# Patient Record
Sex: Male | Born: 1960 | Race: White | Hispanic: No | Marital: Married | Smoking: Former smoker
Health system: Southern US, Community
[De-identification: ages and names within clinical notes are randomized; demographics above are authoritative.]

## PROBLEM LIST (undated history)

## (undated) DIAGNOSIS — I10 Essential (primary) hypertension: Secondary | ICD-10-CM

## (undated) DIAGNOSIS — E785 Hyperlipidemia, unspecified: Secondary | ICD-10-CM

## (undated) DIAGNOSIS — K219 Gastro-esophageal reflux disease without esophagitis: Secondary | ICD-10-CM

## (undated) DIAGNOSIS — I2510 Atherosclerotic heart disease of native coronary artery without angina pectoris: Secondary | ICD-10-CM

## (undated) HISTORY — DX: Gastro-esophageal reflux disease without esophagitis: K21.9

## (undated) HISTORY — DX: Essential (primary) hypertension: I10

## (undated) HISTORY — PX: TONSILLECTOMY: SUR1361

---

## 1985-08-05 HISTORY — PX: INGUINAL HERNIA REPAIR: SUR1180

## 2010-08-07 ENCOUNTER — Encounter: Payer: Self-pay | Admitting: Cardiovascular Disease

## 2010-08-21 ENCOUNTER — Encounter: Payer: Self-pay | Admitting: Cardiovascular Disease

## 2010-09-13 ENCOUNTER — Encounter: Payer: Self-pay | Admitting: Cardiovascular Disease

## 2010-09-13 ENCOUNTER — Ambulatory Visit (INDEPENDENT_AMBULATORY_CARE_PROVIDER_SITE_OTHER): Payer: PRIVATE HEALTH INSURANCE | Admitting: Cardiovascular Disease

## 2010-09-13 ENCOUNTER — Encounter (INDEPENDENT_AMBULATORY_CARE_PROVIDER_SITE_OTHER): Payer: PRIVATE HEALTH INSURANCE

## 2010-09-13 DIAGNOSIS — E785 Hyperlipidemia, unspecified: Secondary | ICD-10-CM

## 2010-09-13 DIAGNOSIS — IMO0001 Reserved for inherently not codable concepts without codable children: Secondary | ICD-10-CM | POA: Insufficient documentation

## 2010-09-13 DIAGNOSIS — R079 Chest pain, unspecified: Secondary | ICD-10-CM

## 2010-09-13 DIAGNOSIS — I10 Essential (primary) hypertension: Secondary | ICD-10-CM

## 2010-09-13 DIAGNOSIS — E1169 Type 2 diabetes mellitus with other specified complication: Secondary | ICD-10-CM | POA: Insufficient documentation

## 2010-09-20 NOTE — Assessment & Plan Note (Signed)
Summary: CHEST PAIN/HTN/[facility]   Visit Type:  Initial Consult Primary Provider:  Dr. Stanford Scotland [facility]  CC:  c/o indigestion mostly; but occas. gets anxious and has chest pain.Marland Kitchen  History of Present Illness: Alec Ali is a very pleasant 50 year old gentleman, patient of Dr. Andrey Spearman, who works in [employer], history of hypertension, GERD who presents for evaluation of recent episodes of chest pain.  He reports that at the end of January, it was his birthday and he drank a significant amount of Christiane Ha. 2 days later, he had significant chest pain with radiation into the left chest. He is especially nervous about his chest pain episode given a aunt who had recent heart attack. He reports his parents are in good health with no premature coronary artery disease.  The chest pain episode lasted all night, with stuttering chest pain since then, typically at rest. He does work in [employer] representative and denies any significant chest pain with exertion. He still nervous about his symptoms. He has lost significant weight over the past month by changing his diet. He estimates 15 pounds.  EKG shows normal sinus rhythm with rate 91 beats per minute, Nonspecific ST changes in the anterolateral leads concerning for repolarization abnormality/LVH  Current Medications (verified): 1)  Lisinopril 30 Mg Tabs (Lisinopril) .Marland Kitchen.. 1 Tablet Once Daily 2)  Zantac 150 Maximum Strength 150 Mg Tabs (Ranitidine Hcl) .Marland Kitchen.. 1 Tablet Once Daily 3)  Prilosec Otc 20 Mg Tbec (Omeprazole Magnesium) .... One Tablet Once Daily  Allergies (verified): No Known Drug Allergies  Past History:  Past Medical History: Last updated: 09/13/2010 Hypertension G E R D  Past Surgical History: Last updated: 09/13/2010 Tonsillectomy Inguinal Hernia Repair-1987-bilateral  Family History: Last updated: 09/13/2010 Father: Hypertension,COPD Mother: Breast cancer,Diabetes,thyroid problems Maternal grandmother-colon  cancer  Social History: Last updated: 09/13/2010 Full Time Married  Tobacco Use - Former snuff user Alcohol Use - yes-occasional Regular Exercise - no Drug Use - no  Risk Factors: Exercise: no (09/13/2010)  Risk Factors: Smoking Status: quit (09/13/2010)  Review of Systems       The patient complains of chest pain.  The patient denies fever, weight loss, weight gain, vision loss, decreased hearing, hoarseness, syncope, dyspnea on exertion, peripheral edema, prolonged cough, abdominal pain, incontinence, muscle weakness, depression, and enlarged lymph nodes.         GERD sx  Vital Signs:  Patient profile:   50 year old male Height:      71 inches Weight:      246 pounds BMI:     34.43 Pulse rate:   91 / minute BP sitting:   160 / 82  (left arm) Cuff size:   large  Vitals Entered By: Bishop Dublin, CMA (September 13, 2010 3:34 PM)  Physical Exam  General:  Well developed, well nourished, in no acute distress. Head:  normocephalic and atraumatic Neck:  Neck supple, no JVD. No masses, thyromegaly or abnormal cervical nodes. Lungs:  Clear bilaterally to auscultation and percussion. Heart:  Non-displaced PMI, chest non-tender; regular rate and rhythm, S1, S2 without murmurs, rubs or gallops. Carotid upstroke normal, no bruit.  Pedals normal pulses. No edema, no varicosities. Abdomen:  Bowel sounds positive; abdomen soft and non-tender without masses Msk:  Back normal, normal gait. Muscle strength and tone normal. Pulses:  pulses normal in all 4 extremities Extremities:  No clubbing or cyanosis. Neurologic:  Alert and oriented x 3. Skin:  Intact without lesions or rashes. Psych:  Normal affect.   Impression &  Recommendations:  Problem # 1:  CHEST PAIN UNSPECIFIED (ICD-786.50) I suspect his chest pain is atypical in nature, likely secondary to musculoskeletal or GERD. He is still very nervous about his symptoms..  We have scheduled a treadmill studyto be done  today.  The following medications were removed from the medication list:    Lisinopril-hydrochlorothiazide 20-12.5 Mg Tabs (Lisinopril-hydrochlorothiazide) .Marland Kitchen... 1 tablet once daily    Bayer Low Strength 81 Mg Tbec (Aspirin) .Marland Kitchen... 1 tablet once daily His updated medication list for this problem includes:    Lisinopril 30 Mg Tabs (Lisinopril) .Marland Kitchen... 1 tablet once daily  Orders: EKG w/ Interpretation (93000)  Problem # 2:  HYPERLIPIDEMIA-MIXED (ICD-272.4) we are very encouraged by his recent weight loss and diet changes. This should significantly improve his lipid profile  Problem # 3:  HYPERTENSION, BENIGN (ICD-401.1) have asked him to closely track his blood pressure at home. Borderline elevated today.  The following medications were removed from the medication list:    Lisinopril-hydrochlorothiazide 20-12.5 Mg Tabs (Lisinopril-hydrochlorothiazide) .Marland Kitchen... 1 tablet once daily    Bayer Low Strength 81 Mg Tbec (Aspirin) .Marland Kitchen... 1 tablet once daily His updated medication list for this problem includes:    Lisinopril 30 Mg Tabs (Lisinopril) .Marland Kitchen... 1 tablet once daily  Appended Document: CHEST PAIN/HTN/[facility] exercise treadmill report: Performed September 13, 2010 Reason for study; chest pain  Resting EKG showed normal sinus rhythm with rate of 86 beats per minute, nonspecific ST changes in the anterolateral leads, borderline voltage criteria for LVH in the limb leads. resting blood pressure 160/80  Alec Ali exercised for 10 minutes, achieving 12.9 mets, peak heart rate 184 beats per minute with target heart rate of 147 beats minute, peak blood pressure 196/92. in recovery, heart rate improved to 109 beats per minute after 3 minutes, blood pressure still 188/60  He had 3 mm downsloping ST changes at peak exercise, consistent with repolarization abnormality. He had no symptoms of shortness of breath or chest pain with activity.  Summary: EKG findings consistent with LVH and  repolarization abnormality, ST abnormality is downsloping with 3 mm depression consistent with repolarization changes. Findings are equivocal. Given no clinical signs of  angina with heavy exertion up to peak heart rate greater than 180 beats per minute, no further testing is needed. We have discussed with him risk management including further weight loss, blood pressure control.

## 2010-10-16 NOTE — Letter (Signed)
Summary: [facility] Office Visit Note   [facility] Office Visit Note   Imported By: Roderic Ovens 10/08/2010 15:22:00  _____________________________________________________________________  External Attachment:    Type:   Image     Comment:   External Document

## 2010-10-16 NOTE — Letter (Signed)
Summary: [facility] Office Visit Note   [facility] Office Visit Note   Imported By: Roderic Ovens 10/08/2010 15:22:24  _____________________________________________________________________  External Attachment:    Type:   Image     Comment:   External Document

## 2011-02-11 ENCOUNTER — Encounter: Payer: Self-pay | Admitting: Cardiovascular Disease

## 2011-08-08 ENCOUNTER — Ambulatory Visit: Payer: Self-pay | Admitting: Gastroenterology

## 2012-09-16 ENCOUNTER — Ambulatory Visit: Payer: PRIVATE HEALTH INSURANCE | Admitting: Internal Medicine

## 2012-09-17 ENCOUNTER — Ambulatory Visit: Payer: PRIVATE HEALTH INSURANCE | Admitting: Internal Medicine

## 2012-10-02 ENCOUNTER — Ambulatory Visit (INDEPENDENT_AMBULATORY_CARE_PROVIDER_SITE_OTHER): Payer: 59 | Admitting: Internal Medicine

## 2012-10-02 ENCOUNTER — Encounter: Payer: Self-pay | Admitting: Internal Medicine

## 2012-10-02 VITALS — BP 162/78 | HR 97 | Temp 97.9°F | Resp 16 | Ht 71.0 in | Wt 264.5 lb

## 2012-10-02 MED ORDER — METOPROLOL SUCCINATE ER 50 MG PO TB24: 50.0000 mg | ORAL_TABLET | Freq: Every day | ORAL | Status: DC

## 2012-10-02 MED ORDER — LISINOPRIL 30 MG PO TABS: 30.0000 mg | ORAL_TABLET | Freq: Every day | ORAL | Status: DC

## 2012-10-02 NOTE — Progress Notes (Signed)
Patient ID: Alec Ali., male   DOB: Oct 08, 1960, 52 y.o.   MRN: [id number]      Patient Active Problem List  Diagnosis  . HYPERLIPIDEMIA-MIXED  . HYPERTENSION, BENIGN  . CHEST PAIN UNSPECIFIED  . Obesity, unspecified    Subjective:  CC:   Chief Complaint  Patient presents with  . Establish Care    HPI:   Alec Ray Antoneo Ghrist. is a 52 y.o. male who presents as a new patient to establish primary care with the chief complaint of wt gain of 15 lbs over the holidays.  Reports a big appetite.  Bfast : special K and banana   Lunch: salad with a protein . Dinner: eats half a pizza once a week,  Home cooked meals during the week. Not much red meat. Grilled chicken,  Chicken alfredo,  Starts hiking every Spring through Fall during hiking season every weekend.  Hikes 5-10 miles per day at 10K feet with no symptoms of chest pain claudication or pulmonary edema  HTN:  Needs lisinopril refilled on Sunday .  Needs all meds refilled   History of chest pain,  Occurred after a night of heavy drinking.  Negative stress test at [facility]  In 2012,went on an anti reflux diet  followed by an endoscopy Jan 2013.  Treated with organic vinegar for immediate relief , never took the prilosec prescribed by [facility] because one week trial made it worse.  Ended up losing  20 lbs due to persistent symptom aggravated by eating. Has heartburn only  occasionally now when really hungry ,  Not treated.    Past Medical History  Diagnosis Date  . Hypertension   . GERD (gastroesophageal reflux disease)     Past Surgical History  Procedure Laterality Date  . Tonsillectomy    . Inguinal hernia repair  1987    bilateral    Family History  Problem Relation Age of Onset  . Breast cancer Mother   . Diabetes Mother   . Thyroid disease Mother   . Hypertension Father   . COPD Father   . Colon cancer Maternal Grandmother     History   Social History  . Marital Status: Married    Spouse Name: N/A   Number of Children: N/A  . Years of Education: N/A   Occupational History  . CONSTRUCTION     Full time   Social History Main Topics  . Smoking status: Former Games developer  . Smokeless tobacco: Former Neurosurgeon    Types: Snuff  . Alcohol Use: Yes     Comment: Occasional  . Drug Use: No  . Sexually Active: Not on file   Other Topics Concern  . Not on file   Social History Narrative   Married   Does not get regular exercise       @ALLHX @    Review of Systems:   The remainder of the review of systems was negative except those addressed in the HPI.       Objective:  BP 162/78  Pulse 97  Temp(Src) 97.9 F (36.6 C) (Oral)  Resp 16  Ht 5\' 11"  (1.803 m)  Wt 264 lb 8 oz (119.976 kg)  BMI 36.91 kg/m2  SpO2 99%  General appearance: alert, cooperative and appears stated age Ears: normal TM's and external ear canals both ears Throat: lips, mucosa, and tongue normal; teeth and gums normal Neck: no adenopathy, no carotid bruit, supple, symmetrical, trachea midline and thyroid not enlarged, symmetric, no tenderness/mass/nodules Back:  symmetric, no curvature. ROM normal. No CVA tenderness. Lungs: clear to auscultation bilaterally Heart: regular rate and rhythm, S1, S2 normal, no murmur, click, rub or gallop Abdomen: soft, non-tender; bowel sounds normal; no masses,  no organomegaly Pulses: 2+ and symmetric Skin: Skin color, texture, turgor normal. No rashes or lesions Lymph nodes: Cervical, supraclavicular, and axillary nodes normal.  Assessment and Plan:  HYPERTENSION, BENIGN Not well controlled on current regimen. Renal function due, no changes today unitl renal function can be assessed and home mesaurements can be reviewed.   Obesity, unspecified BMI is 36.  I have addressed  BMI and recommended a low glycemic index diet utilizing smaller more frequent meals to increase metabolism.  I have also recommended that patient start exercising with a goal of 30 minutes of aerobic  exercise a minimum of 5 days per week. Screening for lipid disorders, thyroid and diabetes to be done next week    HYPERLIPIDEMIA-MIXED Managed in the past with diet and fish oil. Repeat fasting lipids to be done next week.  CHEST PAIN UNSPECIFIED Resolved and attributed to esophagitis after cardiac evaluation 2012 was normal.  GERD (gastroesophageal reflux disease) With a history of severe esophagitis apparently undertreated by patient per his report of avoiding use of PPIs.His symptoms are currently infrequent occurring less than 2 or 3 times per week. He had an endoscopy in January 2013 it was reportedly normal. Recommended to use over-the-counter H2 blockers as needed for symptom control and to change to daily use of PPI if his symptoms become more frequent.the risks of untreated GERD were discussed with patient specifically her esophagus and esophageal cancer.   A total of 45 minutes was spent with patient more than half of which was spent in counseling, reviewing records from other providers and coordination of care.  Updated Medication List Outpatient Encounter Prescriptions as of 10/02/2012  Medication Sig Dispense Refill  . Cholecalciferol (VITAMIN D) 2000 UNITS tablet Take 2,000 Units by mouth daily.      . fish oil-omega-3 fatty acids 1000 MG capsule Take 2 g by mouth daily.      Marland Kitchen lisinopril (PRINIVIL,ZESTRIL) 30 MG tablet Take 1 tablet (30 mg total) by mouth daily.  30 tablet  5  . metoprolol succinate (TOPROL-XL) 50 MG 24 hr tablet Take 1 tablet (50 mg total) by mouth daily. Take with or immediately following a meal.  30 tablet  5  . ranitidine (ZANTAC) 150 MG tablet Take 150 mg by mouth daily.        . [DISCONTINUED] lisinopril (PRINIVIL,ZESTRIL) 30 MG tablet Take 30 mg by mouth daily.        . [DISCONTINUED] metoprolol succinate (TOPROL-XL) 50 MG 24 hr tablet Take 50 mg by mouth daily. Take with or immediately following a meal.      . [DISCONTINUED] omeprazole (PRILOSEC OTC)  20 MG tablet Take 20 mg by mouth daily.         No facility-administered encounter medications on file as of 10/02/2012.

## 2012-10-02 NOTE — Patient Instructions (Addendum)
You can lose up to 10%  Of your current body weight over the next 3 months with proper diet and exercise (30 minutes 5 days per week)   This is  my version of a  "Low GI"  Diet:  It is not ultra low carb, but will still lower your blood sugars and allow you to lose 5 to 10 lbs per month if you follow it carefully. All of the foods can be found at grocery stores and in bulk at [employer].  The Atkins protein bars and shakes are available in more varieties at [employer], [employer] and [employer].     7 AM Breakfast:  Low carbohydrate Protein  Shakes (I recommend the EAS AdvantEdge "Carb Control" shakes  Or the low carb shakes by Atkins.   Both are available everywhere:  In  cases at [employer]Js  Or in 4 packs at grocery stores and pharmacies  2.5 carbs  (Alternative is  a toasted Arnold's Sandwhich Thin w/ peanut butter, a "Bagel Thin" with cream cheese and salmon) or  a scrambled egg burrito made with a low carb tortilla .  Avoid cereal and bananas, oatmeal too unless you are cooking the old fashioned kind that takes 30-40 minutes to prepare.  the rest is overly processed, has minimal fiber, and is loaded with carbohydrates!   10 AM: Protein bar by Atkins (the snack size, under 200 cal).  There are many varieties , available widely again or in bulk in limited varieties at BJs)  Other so called "protein bars" tend to be loaded with carbohydrates.  Remember, in food advertising, the word "energy" is synonymous for " carbohydrate."  Lunch: sandwich of [place], (or any lunchmeat, grilled meat or canned tuna), fresh avocado, mayonnaise  and cheese on a lower carbohydrate pita bread, flatbread, or tortilla . Ok to use regular mayonnaise. The bread is the only source or carbohydrate that can be decreased (Joseph's makes a pita bread and a flat bread that are 50 cal and 4 net carbs ; Toufayan makes a low carb flatbread that's 100 cal and 9 net carbs  and  Mission makes a low carb whole wheat tortilla  That is 210 cal and 6 net  carbs)  3 PM:  Mid day :  Another protein bar,  Or a  cheese stick (100 cal, 0 carbs),  Or 1 ounce of  almonds, walnuts, pistachios, pecans, peanuts,  Macadamia nuts. Or a Dannon light n Fit greek yogurt, 80 cal 8 net carbs . Avoid "granola"; the dried cranberries and raisins are loaded with carbohydrates. Mixed nuts ok if no raisins or cranberries or dried fruit.      6 PM  Dinner:  "mean and green:"  Meat/chicken/fish or a high protein legume; , with a green salad, and a low GI  Veggie (broccoli, cauliflower, green beans, spinach, brussel sprouts. Lima beans) : Avoid "Low fat dressings, as well as Reyne Dumas and [address]! They are loaded with sugar! Instead use ranch, vinagrette,  Blue cheese, etc.  There is a low carb pasta by Dreamfield's available at [facility] that is acceptable and tastes great. Try Michel Angel's chicken piccata over low carb pasta. The chicken dish is 0 carbs, and can be found in frozen section at BJs and [employer]. Also try [employer] "Carnitas" (pulled pork, no sauce,  0 carbs) and his pot roast.   both are in the refrigerated section at BJs   Dreamfield's makes a low carb pasta only 5  g/serving.  Available at all grocery stores,  And tastes like normal pasta  9 PM snack : Breyer's "low carb" fudgsicle or  ice cream bar (Carb Smart line), or  Weight Watcher's ice cream bar , or another "no sugar added" ice cream;a serving of fresh berries/cherries with whipped cream (Avoid bananas, pineapple, grapes  and watermelon on a regular basis because they are high in sugar)   Remember that snack Substitutions should be less than 10 carbs per serving and meals < 20 carbs. Remember to subtract fiber grams and sugar alcohols to get the "net carbs."

## 2012-10-04 ENCOUNTER — Encounter: Payer: Self-pay | Admitting: Internal Medicine

## 2012-10-04 DIAGNOSIS — K219 Gastro-esophageal reflux disease without esophagitis: Secondary | ICD-10-CM | POA: Insufficient documentation

## 2012-10-04 NOTE — Assessment & Plan Note (Addendum)
Not well controlled on current regimen. Renal function due, no changes today unitl renal function can be assessed and home mesaurements can be reviewed.

## 2012-10-04 NOTE — Assessment & Plan Note (Signed)
Managed in the past with diet and fish oil. Repeat fasting lipids to be done next week.

## 2012-10-04 NOTE — Assessment & Plan Note (Addendum)
Resolved and attributed to esophagitis after cardiac evaluation 2012 was normal.

## 2012-10-04 NOTE — Assessment & Plan Note (Signed)
BMI is 36.  I have addressed  BMI and recommended a low glycemic index diet utilizing smaller more frequent meals to increase metabolism.  I have also recommended that patient start exercising with a goal of 30 minutes of aerobic exercise a minimum of 5 days per week. Screening for lipid disorders, thyroid and diabetes to be done next week

## 2012-10-04 NOTE — Assessment & Plan Note (Addendum)
With a history of severe esophagitis apparently undertreated by patient per his report of avoiding use of PPIs.His symptoms are currently infrequent occurring less than 2 or 3 times per week. He had an endoscopy in January 2013 it was reportedly normal. Recommended to use over-the-counter H2 blockers as needed for symptom control and to change to daily use of PPI if his symptoms become more frequent.the risks of untreated GERD were discussed with patient specifically her esophagus and esophageal cancer.

## 2012-10-05 ENCOUNTER — Encounter: Payer: Self-pay | Admitting: Internal Medicine

## 2012-10-05 ENCOUNTER — Telehealth: Payer: Self-pay | Admitting: Internal Medicine

## 2012-10-05 DIAGNOSIS — E559 Vitamin D deficiency, unspecified: Secondary | ICD-10-CM | POA: Insufficient documentation

## 2012-10-05 MED ORDER — LISINOPRIL 10 MG PO TABS: 10.0000 mg | ORAL_TABLET | Freq: Every day | ORAL | Status: DC

## 2012-10-05 NOTE — Telephone Encounter (Signed)
Pt went and picked up Lisinopril medication but it was written for 30 mg and it was already picked up. He is needing the 40 mg's and the pt is not sure what to do ???

## 2012-10-05 NOTE — Telephone Encounter (Signed)
He can pick up the 10 mg lisinopril rx and take a 30 and a 10.  Please correct chart going forward so this doesn't keep happening.

## 2012-10-06 ENCOUNTER — Other Ambulatory Visit: Payer: Self-pay | Admitting: General Practice

## 2012-10-06 ENCOUNTER — Telehealth: Payer: Self-pay | Admitting: Internal Medicine

## 2012-10-06 MED ORDER — LISINOPRIL 40 MG PO TABS: 40.0000 mg | ORAL_TABLET | Freq: Every day | ORAL | Status: DC

## 2012-10-06 NOTE — Telephone Encounter (Signed)
Confirmed with pharmacy that they have the script for Lisinopril 40mg . Ins only pay for #30. Patients wife is aware.

## 2012-10-06 NOTE — Telephone Encounter (Signed)
This was changed to Lisinopril 40mg . And reordered.

## 2012-10-06 NOTE — Telephone Encounter (Signed)
Spoke with patients wife  and they did not accept the 30mg  or 10mg  prescription for the Lisinopril. The patients needs the 40mg  dose called in and would like a 90 day supply. Pt last seen 10-02-12.

## 2012-10-06 NOTE — Telephone Encounter (Signed)
The pharmacy took back the prescription for the lisinopril 30mg  and the lisinopril 10mg  . The patient's wife is wanting the patient to have lisinopril 40 mg in one dose not two pills for #90.

## 2012-10-22 ENCOUNTER — Encounter: Payer: Self-pay | Admitting: Internal Medicine

## 2012-12-31 ENCOUNTER — Ambulatory Visit (INDEPENDENT_AMBULATORY_CARE_PROVIDER_SITE_OTHER): Payer: BC Managed Care – PPO | Admitting: Internal Medicine

## 2012-12-31 ENCOUNTER — Encounter: Payer: Self-pay | Admitting: Internal Medicine

## 2012-12-31 VITALS — BP 142/82 | HR 79 | Temp 97.8°F | Resp 16 | Wt 256.8 lb

## 2012-12-31 DIAGNOSIS — I10 Essential (primary) hypertension: Secondary | ICD-10-CM

## 2012-12-31 DIAGNOSIS — E119 Type 2 diabetes mellitus without complications: Secondary | ICD-10-CM

## 2012-12-31 DIAGNOSIS — E669 Obesity, unspecified: Secondary | ICD-10-CM

## 2012-12-31 DIAGNOSIS — E785 Hyperlipidemia, unspecified: Secondary | ICD-10-CM

## 2012-12-31 LAB — LIPID PANEL
Cholesterol: 181 mg/dL (ref 0–200)
HDL: 34.5 mg/dL — ABNORMAL LOW (ref >39.00)
Total CHOL/HDL Ratio: 5
Triglycerides: 269 mg/dL — ABNORMAL HIGH (ref 0.0–149.0)

## 2012-12-31 LAB — LDL CHOLESTEROL, DIRECT: Direct LDL: 96.2 mg/dL

## 2012-12-31 NOTE — Patient Instructions (Signed)
Your blood pressure has improved and is very close to goal .  Continue to take the lisinopril and metoprolol  Your first goal is 10% , or 26 lbs total .  This is the degree of  weight loss significant enough to improve blood pressure  Return in 6 months (3 monhts if your labs indicate diabetes)

## 2012-12-31 NOTE — Assessment & Plan Note (Signed)
Well controlled on current regimen. Renal function stable, no changes today. 

## 2012-12-31 NOTE — Progress Notes (Signed)
Patient ID: Alec Ali., male   DOB: 1961-06-18, 52 y.o.   MRN: [id number]    Patient Active Problem List   Diagnosis Date Noted  . Unspecified vitamin D deficiency 10/05/2012  . Obesity, unspecified 10/04/2012  . GERD (gastroesophageal reflux disease) 10/04/2012  . HYPERLIPIDEMIA-MIXED 09/13/2010  . HYPERTENSION, BENIGN 09/13/2010  . CHEST PAIN UNSPECIFIED 09/13/2010    Subjective:  CC:   Chief Complaint  Patient presents with  . Follow-up    HPI:   Alec Aliis a 52 y.o. male who presents 3 month follow up on hypertension, hyperlipidemia and obesity.  Has lost 8 lbs,  And his blood pressure has  improved.  However he has been unable to sustain exercise regimen due to persistent knee pain .     Past Medical History  Diagnosis Date  . Hypertension   . GERD (gastroesophageal reflux disease)     EGD normal 08/08/2011 [place]     Past Surgical History  Procedure Laterality Date  . Tonsillectomy    . Inguinal hernia repair  1987    bilateral       The following portions of the patient's history were reviewed and updated as appropriate: Allergies, current medications, and problem list.    Review of Systems:   12 Pt  review of systems was negative except those addressed in the HPI,     History   Social History  . Marital Status: Married    Spouse Name: N/A    Number of Children: N/A  . Years of Education: N/A   Occupational History  . CONSTRUCTION     Full time   Social History Main Topics  . Smoking status: Former Games developer  . Smokeless tobacco: Former Neurosurgeon    Types: Snuff  . Alcohol Use: Yes     Comment: Occasional  . Drug Use: No  . Sexually Active: Not on file   Other Topics Concern  . Not on file   Social History Narrative   Married   Does not get regular exercise    Objective:  BP 142/82  Pulse 79  Temp(Src) 97.8 F (36.6 C) (Oral)  Resp 16  Wt 256 lb 12 oz (116.461 kg)  BMI 35.83 kg/m2  SpO2 99%  General  appearance: alert, cooperative and appears stated age Ears: normal TM's and external ear canals both ears Throat: lips, mucosa, and tongue normal; teeth and gums normal Neck: no adenopathy, no carotid bruit, supple, symmetrical, trachea midline and thyroid not enlarged, symmetric, no tenderness/mass/nodules Back: symmetric, no curvature. ROM normal. No CVA tenderness. Lungs: clear to auscultation bilaterally Heart: regular rate and rhythm, S1, S2 normal, no murmur, click, rub or gallop Abdomen: soft, non-tender; bowel sounds normal; no masses,  no organomegaly Pulses: 2+ and symmetric Skin: Skin color, texture, turgor normal. No rashes or lesions Lymph nodes: Cervical, supraclavicular, and axillary nodes normal.  Assessment and Plan:  HYPERTENSION, BENIGN Well controlled on current regimen. Renal function stable, no changes today.  HYPERLIPIDEMIA-MIXED Trigs are now 296 despite a wt loss of 6 lbs since last visit .  Will discuss fenofibrate trial    Updated Medication List Outpatient Encounter Prescriptions as of 12/31/2012  Medication Sig Dispense Refill  . Cholecalciferol (VITAMIN D) 2000 UNITS tablet Take 2,000 Units by mouth daily.      . fish oil-omega-3 fatty acids 1000 MG capsule Take 2 g by mouth daily.      Marland Kitchen lisinopril (PRINIVIL,ZESTRIL) 40 MG tablet Take 1 tablet (40  mg total) by mouth daily.  90 tablet  3  . metoprolol succinate (TOPROL-XL) 50 MG 24 hr tablet Take 1 tablet (50 mg total) by mouth daily. Take with or immediately following a meal.  30 tablet  5  . ranitidine (ZANTAC) 150 MG tablet Take 150 mg by mouth daily.         No facility-administered encounter medications on file as of 12/31/2012.     No orders of the defined types were placed in this encounter.    Return in about 6 months (around 07/03/2013).

## 2012-12-31 NOTE — Assessment & Plan Note (Signed)
Trigs are now 296 despite a wt loss of 6 lbs since last visit .  Will discuss fenofibrate trial

## 2013-01-03 ENCOUNTER — Encounter: Payer: Self-pay | Admitting: Internal Medicine

## 2013-01-03 DIAGNOSIS — E119 Type 2 diabetes mellitus without complications: Secondary | ICD-10-CM | POA: Insufficient documentation

## 2013-04-05 ENCOUNTER — Other Ambulatory Visit: Payer: Self-pay | Admitting: Internal Medicine

## 2013-04-06 NOTE — Telephone Encounter (Signed)
Eprescribed.

## 2013-06-10 ENCOUNTER — Other Ambulatory Visit: Payer: Self-pay

## 2013-07-06 ENCOUNTER — Encounter: Payer: Self-pay | Admitting: Internal Medicine

## 2013-07-06 ENCOUNTER — Ambulatory Visit (INDEPENDENT_AMBULATORY_CARE_PROVIDER_SITE_OTHER): Payer: BC Managed Care – PPO | Admitting: Internal Medicine

## 2013-07-06 VITALS — BP 146/80 | HR 63 | Temp 98.0°F | Resp 12 | Ht 71.0 in | Wt 248.5 lb

## 2013-07-06 DIAGNOSIS — Z23 Encounter for immunization: Secondary | ICD-10-CM

## 2013-07-06 DIAGNOSIS — I10 Essential (primary) hypertension: Secondary | ICD-10-CM

## 2013-07-06 DIAGNOSIS — E785 Hyperlipidemia, unspecified: Secondary | ICD-10-CM

## 2013-07-06 DIAGNOSIS — E669 Obesity, unspecified: Secondary | ICD-10-CM

## 2013-07-06 DIAGNOSIS — E119 Type 2 diabetes mellitus without complications: Secondary | ICD-10-CM

## 2013-07-06 LAB — MICROALBUMIN / CREATININE URINE RATIO
Creatinine,U: 37.7 mg/dL
Microalb Creat Ratio: 2.1 mg/g (ref 0.0–30.0)
Microalb, Ur: 0.8 mg/dL (ref 0.0–1.9)

## 2013-07-06 LAB — LIPID PANEL
Cholesterol: 179 mg/dL (ref 0–200)
Total CHOL/HDL Ratio: 5
VLDL: 46.6 mg/dL — ABNORMAL HIGH (ref 0.0–40.0)

## 2013-07-06 MED ORDER — LOSARTAN POTASSIUM 50 MG PO TABS: 50.0000 mg | ORAL_TABLET | Freq: Every day | ORAL | Status: DC

## 2013-07-06 NOTE — Assessment & Plan Note (Signed)
Not well controlled on current regimen. checking urine for protein and Renal function.  changing lisinopril to losartan

## 2013-07-06 NOTE — Assessment & Plan Note (Signed)
Repeat A1c and fasting glucose due.  On losartan,  Needs Statin

## 2013-07-06 NOTE — Assessment & Plan Note (Signed)
HIs 10 yr risk od CAD based on May's lipids is 14% without Dx od DM and 23% with the diagnosis.  If fasting glucose is diagnostic today will discuss trial of therapy.

## 2013-07-06 NOTE — Progress Notes (Signed)
Patient ID: Alec Ali., male   DOB: 17-Nov-1960, 52 y.o.   MRN: [id number]   Patient Active Problem List   Diagnosis Date Noted  . Type II or unspecified type diabetes mellitus without mention of complication, not stated as uncontrolled 01/03/2013  . Unspecified vitamin D deficiency 10/05/2012  . Obesity, unspecified 10/04/2012  . GERD (gastroesophageal reflux disease) 10/04/2012  . HYPERLIPIDEMIA-MIXED 09/13/2010  . HYPERTENSION, BENIGN 09/13/2010  . CHEST PAIN UNSPECIFIED 09/13/2010    Subjective:  CC:   Chief Complaint  Patient presents with  . Follow-up    6 months    HPI:    Alec Aliis a 52 y.o. male who presents for 6 month follow up on abnormal A1c suggesting diabetes or prediabetes (a1c was 6.3). Hypertension, hyperlipidemia and obesity  .  Has been eliminating refined sugar from diet but still eating "sugar free" cookes and starches from time to time.  Substituting sweet potatoes but putting cinnamon sugar on them!  Lost 10 lbs on diet and with long hikes as exercised but has gained 5 back due to lack of current exercise and willlpower. Still having some sleep issues .  Taking lisinopril once daily .  Not using cigarettes.  Exercisign occasionally but not regularly since he returned from [place]    Past Medical History  Diagnosis Date  . Hypertension   . GERD (gastroesophageal reflux disease)     EGD normal 08/08/2011 [facility]     Past Surgical History  Procedure Laterality Date  . Tonsillectomy    . Inguinal hernia repair  1987    bilateral       The following portions of the patient's history were reviewed and updated as appropriate: Allergies, current medications, and problem list.    Review of Systems:   12 Pt  review of systems was negative except those addressed in the HPI,     History   Social History  . Marital Status: Married    Spouse Name: N/A    Number of Children: N/A  . Years of Education: N/A   Occupational History  .  CONSTRUCTION     Full time   Social History Main Topics  . Smoking status: Former Games developer  . Smokeless tobacco: Former Neurosurgeon    Types: Snuff  . Alcohol Use: Yes     Comment: Occasional  . Drug Use: No  . Sexual Activity: Yes   Other Topics Concern  . Not on file   Social History Narrative   Married   Does not get regular exercise    Objective:  Filed Vitals:   07/06/13 0819  BP: 146/80  Pulse: 63  Temp: 98 F (36.7 C)  Resp: 12     General appearance: alert, cooperative and appears stated age Ears: normal TM's and external ear canals both ears Throat: lips, mucosa, and tongue normal; teeth and gums normal Neck: no adenopathy, no carotid bruit, supple, symmetrical, trachea midline and thyroid not enlarged, symmetric, no tenderness/mass/nodules Back: symmetric, no curvature. ROM normal. No CVA tenderness. Lungs: clear to auscultation bilaterally Heart: regular rate and rhythm, S1, S2 normal, no murmur, click, rub or gallop Abdomen: soft, non-tender; bowel sounds normal; no masses,  no organomegaly Pulses: 2+ and symmetric Skin: Skin color, texture, turgor normal. No rashes or lesions Lymph nodes: Cervical, supraclavicular, and axillary nodes normal.  Assessment and Plan:  HYPERTENSION, BENIGN Not well controlled on current regimen. checking urine for protein and Renal function.  changing lisinopril to losartan  HYPERLIPIDEMIA-MIXED HIs 10 yr risk od CAD based on May's lipids is 14% without Dx od DM and 23% with the diagnosis.  If fasting glucose is diagnostic today will discuss trial of therapy.   Obesity, unspecified I have acommended him on his wt loss, addressed  BMI and encouraged him to lose 10% of body weigh over the next 6 months using a low glycemic index diet and regular exercise a minimum of 5 days per week.   Type II or unspecified type diabetes mellitus without mention of complication, not stated as uncontrolled Repeat A1c and fasting glucose due.  On  losartan,  Needs Statin    Updated Medication List Outpatient Encounter Prescriptions as of 07/06/2013  Medication Sig  . Cholecalciferol (VITAMIN D) 2000 UNITS tablet Take 2,000 Units by mouth daily.  . fish oil-omega-3 fatty acids 1000 MG capsule Take 2 g by mouth daily.  . metoprolol succinate (TOPROL-XL) 50 MG 24 hr tablet TAKE ONE TABLET BY MOUTH ONCE DAILY. TAKE WITH OR IMMEDIATELY FOLLOWING A MEAL  . ranitidine (ZANTAC) 150 MG tablet Take 150 mg by mouth daily.    . [DISCONTINUED] lisinopril (PRINIVIL,ZESTRIL) 40 MG tablet Take 1 tablet (40 mg total) by mouth daily.  Marland Kitchen losartan (COZAAR) 50 MG tablet Take 1 tablet (50 mg total) by mouth daily.     Orders Placed This Encounter  Procedures  . Lipid panel  . Hemoglobin A1c  . Microalbumin / creatinine urine ratio  . Microalbumin / creatinine urine ratio    No Follow-up on file.

## 2013-07-06 NOTE — Patient Instructions (Signed)
We are changing your lisinopril to losartan for better control.  If your blood glucose today is < 125,  And your A1c is < 6.4,  You are a Prediabetic  .Hypertension As your heart beats, it forces blood through your arteries. This force is your blood pressure. If the pressure is too high, it is called hypertension (HTN) or high blood pressure. HTN is dangerous because you may have it and not know it. High blood pressure may mean that your heart has to work harder to pump blood. Your arteries may be narrow or stiff. The extra work puts you at risk for heart disease, stroke, and other problems.  Blood pressure consists of two numbers, a higher number over a lower, 110/72, for example. It is stated as "110 over 72." The ideal is below 120 for the top number (systolic) and under 80 for the bottom (diastolic). Write down your blood pressure today. You should pay close attention to your blood pressure if you have certain conditions such as:  Heart failure.  Prior heart attack.  Diabetes  Chronic kidney disease.  Prior stroke.  Multiple risk factors for heart disease. To see if you have HTN, your blood pressure should be measured while you are seated with your arm held at the level of the heart. It should be measured at least twice. A one-time elevated blood pressure reading (especially in the Emergency Department) does not mean that you need treatment. There may be conditions in which the blood pressure is different between your right and left arms. It is important to see your caregiver soon for a recheck. Most people have essential hypertension which means that there is not a specific cause. This type of high blood pressure may be lowered by changing lifestyle factors such as:  Stress.  Smoking.  Lack of exercise.  Excessive weight.  Drug/tobacco/alcohol use.  Eating less salt. Most people do not have symptoms from high blood pressure until it has caused damage to the body. Effective  treatment can often prevent, delay or reduce that damage. TREATMENT  When a cause has been identified, treatment for high blood pressure is directed at the cause. There are a large number of medications to treat HTN. These fall into several categories, and your caregiver will help you select the medicines that are best for you. Medications may have side effects. You should review side effects with your caregiver. If your blood pressure stays high after you have made lifestyle changes or started on medicines,   Your medication(s) may need to be changed.  Other problems may need to be addressed.  Be certain you understand your prescriptions, and know how and when to take your medicine.  Be sure to follow up with your caregiver within the time frame advised (usually within two weeks) to have your blood pressure rechecked and to review your medications.  If you are taking more than one medicine to lower your blood pressure, make sure you know how and at what times they should be taken. Taking two medicines at the same time can result in blood pressure that is too low. SEEK IMMEDIATE MEDICAL CARE IF:  You develop a severe headache, blurred or changing vision, or confusion.  You have unusual weakness or numbness, or a faint feeling.  You have severe chest or abdominal pain, vomiting, or breathing problems. MAKE SURE YOU:   Understand these instructions.  Will watch your condition.  Will get help right away if you are not doing well or get  worse. Document Released: 07/22/2005 Document Revised: 10/14/2011 Document Reviewed: 03/11/2008 [facility] Patient Information 2014 [place].

## 2013-07-06 NOTE — Assessment & Plan Note (Signed)
I have acommended him on his wt loss, addressed  BMI and encouraged him to lose 10% of body weigh over the next 6 months using a low glycemic index diet and regular exercise a minimum of 5 days per week.

## 2013-07-07 ENCOUNTER — Ambulatory Visit: Payer: BC Managed Care – PPO

## 2013-07-07 ENCOUNTER — Encounter: Payer: Self-pay | Admitting: Internal Medicine

## 2013-07-07 DIAGNOSIS — E119 Type 2 diabetes mellitus without complications: Secondary | ICD-10-CM

## 2013-07-07 LAB — COMPREHENSIVE METABOLIC PANEL
AST: 25 U/L (ref 0–37)
Alkaline Phosphatase: 52 U/L (ref 39–117)
BUN: 13 mg/dL (ref 6–23)
Glucose, Bld: 113 mg/dL — ABNORMAL HIGH (ref 70–99)
Potassium: 5 mEq/L (ref 3.5–5.1)
Sodium: 140 mEq/L (ref 135–145)
Total Bilirubin: 0.9 mg/dL (ref 0.3–1.2)

## 2013-08-02 ENCOUNTER — Other Ambulatory Visit: Payer: Self-pay | Admitting: Internal Medicine

## 2013-09-03 ENCOUNTER — Telehealth: Payer: Self-pay | Admitting: *Deleted

## 2013-09-03 MED ORDER — METOPROLOL SUCCINATE ER 50 MG PO TB24: ORAL_TABLET | ORAL | Status: DC

## 2013-09-03 NOTE — Telephone Encounter (Signed)
Rx sent to pharmacy by escript  

## 2013-09-23 ENCOUNTER — Ambulatory Visit: Payer: Self-pay | Admitting: Emergency Medicine

## 2014-01-05 ENCOUNTER — Ambulatory Visit: Payer: BC Managed Care – PPO | Admitting: Internal Medicine

## 2014-01-12 ENCOUNTER — Ambulatory Visit: Payer: BC Managed Care – PPO | Admitting: Internal Medicine

## 2014-01-19 ENCOUNTER — Encounter: Payer: Self-pay | Admitting: Internal Medicine

## 2014-01-19 ENCOUNTER — Ambulatory Visit (INDEPENDENT_AMBULATORY_CARE_PROVIDER_SITE_OTHER): Payer: BC Managed Care – PPO | Admitting: Internal Medicine

## 2014-01-19 VITALS — BP 156/82 | HR 68 | Temp 98.6°F | Resp 18 | Ht 71.0 in | Wt 245.0 lb

## 2014-01-19 DIAGNOSIS — I10 Essential (primary) hypertension: Secondary | ICD-10-CM

## 2014-01-19 DIAGNOSIS — E785 Hyperlipidemia, unspecified: Secondary | ICD-10-CM

## 2014-01-19 DIAGNOSIS — E669 Obesity, unspecified: Secondary | ICD-10-CM

## 2014-01-19 DIAGNOSIS — E119 Type 2 diabetes mellitus without complications: Secondary | ICD-10-CM

## 2014-01-19 LAB — COMPREHENSIVE METABOLIC PANEL
ALK PHOS: 55 U/L (ref 39–117)
ALT: 23 U/L (ref 0–53)
AST: 29 U/L (ref 0–37)
Albumin: 4.8 g/dL (ref 3.5–5.2)
BUN: 16 mg/dL (ref 6–23)
CO2: 26 mEq/L (ref 19–32)
CREATININE: 0.8 mg/dL (ref 0.4–1.5)
Calcium: 9.6 mg/dL (ref 8.4–10.5)
Chloride: 104 mEq/L (ref 96–112)
GFR: 108.55 mL/min (ref >60.00)
GLUCOSE: 109 mg/dL — AB (ref 70–99)
Potassium: 4.4 mEq/L (ref 3.5–5.1)
Sodium: 137 mEq/L (ref 135–145)
Total Bilirubin: 0.8 mg/dL (ref 0.2–1.2)
Total Protein: 7.9 g/dL (ref 6.0–8.3)

## 2014-01-19 LAB — LIPID PANEL
Cholesterol: 165 mg/dL (ref 0–200)
HDL: 37.3 mg/dL — ABNORMAL LOW (ref >39.00)
LDL CALC: 99 mg/dL (ref 0–99)
NonHDL: 127.7
Total CHOL/HDL Ratio: 4
Triglycerides: 145 mg/dL (ref 0.0–149.0)
VLDL: 29 mg/dL (ref 0.0–40.0)

## 2014-01-19 LAB — MICROALBUMIN / CREATININE URINE RATIO
Creatinine,U: 107.3 mg/dL
Microalb Creat Ratio: 0.4 mg/g (ref 0.0–30.0)
Microalb, Ur: 0.4 mg/dL (ref 0.0–1.9)

## 2014-01-19 LAB — HEMOGLOBIN A1C: Hgb A1c MFr Bld: 6 % (ref 4.6–6.5)

## 2014-01-19 MED ORDER — LOSARTAN POTASSIUM 100 MG PO TABS: 100.0000 mg | ORAL_TABLET | Freq: Every day | ORAL | Status: DC

## 2014-01-19 NOTE — Assessment & Plan Note (Addendum)
Not well controlled on current regimen. checking urine for protein and Renal function.  changing lisinopril to losartan .  Lab Results  Component Value Date   MICROALBUR 0.4 01/19/2014

## 2014-01-19 NOTE — Patient Instructions (Signed)
You are doing well,  Keep up the low glycemic index diet  I am increasing your losartan dose to 100 mg daily for goal BP of 130/80 or less  The new low carb snack I showed you is by [employer]

## 2014-01-19 NOTE — Progress Notes (Signed)
Pre-visit discussion using our clinic review tool. No additional management support is needed unless otherwise documented below in the visit note.  

## 2014-01-20 ENCOUNTER — Telehealth: Payer: Self-pay | Admitting: Internal Medicine

## 2014-01-20 ENCOUNTER — Encounter: Payer: Self-pay | Admitting: Internal Medicine

## 2014-01-20 NOTE — Telephone Encounter (Signed)
Relevant patient education assigned to patient using Emmi. ° °

## 2014-01-21 ENCOUNTER — Encounter: Payer: Self-pay | Admitting: Internal Medicine

## 2014-01-21 NOTE — Assessment & Plan Note (Addendum)
He has reduced his A1c to 6.0 as well as his  fasting glucose.  Despite hypertension has no proteinuria.  Foot exam normal.  Eye exam up to date.   Lab Results  Component Value Date   HGBA1C 6.0 01/19/2014

## 2014-01-21 NOTE — Assessment & Plan Note (Signed)
Markedly improved with diet and exercise.    Lab Results  Component Value Date   CHOL 165 01/19/2014   CHOL 179 07/06/2013   CHOL 181 12/31/2012   Lab Results  Component Value Date   HDL 37.30* 01/19/2014   HDL 34.70* 07/06/2013   HDL 34.50* 12/31/2012   Lab Results  Component Value Date   LDLCALC 99 01/19/2014   Lab Results  Component Value Date   TRIG 145.0 01/19/2014   TRIG 233.0* 07/06/2013   TRIG 269.0* 12/31/2012   Lab Results  Component Value Date   CHOLHDL 4 01/19/2014   CHOLHDL 5 07/06/2013   CHOLHDL 5 12/31/2012   Lab Results  Component Value Date   LDLDIRECT 103.0 07/06/2013   LDLDIRECT 96.2 12/31/2012

## 2014-01-21 NOTE — Assessment & Plan Note (Signed)
I have congratulated him in reduction of   BMI and weight loss of 11 lbs over the past year and encouraged  Continued weight loss with goal of 10% of body weight over the next 6 months using a low glycemic index diet and regular exercise a minimum of 5 days per week.

## 2014-01-21 NOTE — Progress Notes (Signed)
Patient ID: Alec Gibsononnie Ray Spiegelman Jr., male   DOB: Feb 19, 1961, 53 y.o.   MRN: [id number]   Patient Active Problem List   Diagnosis Date Noted  . Type II or unspecified type diabetes mellitus without mention of complication, not stated as uncontrolled 01/03/2013  . Unspecified vitamin D deficiency 10/05/2012  . Obesity, unspecified 10/04/2012  . GERD (gastroesophageal reflux disease) 10/04/2012  . HYPERLIPIDEMIA-MIXED 09/13/2010  . HYPERTENSION, BENIGN 09/13/2010  . CHEST PAIN UNSPECIFIED 09/13/2010    Subjective:  CC:   Chief Complaint  Patient presents with  . Follow-up    6 month  . Diabetes    HPI:   Alec Ray Waverly Ferrarilley Jr. is a 53 y.o. male who presents for 6 month follow up on chronic conditions including abnormal A1c suggesting diabetes or prediabetes (a1c was 6.3) .  Has continues to lose weight by following a low glycemic index diet and exercising regularly .   Taking lisinopril once daily .  Not using cigarettes. No new complaints.  Feels better.    Past Medical History  Diagnosis Date  . Hypertension   . GERD (gastroesophageal reflux disease)     EGD normal 08/08/2011 [facility]     Past Surgical History  Procedure Laterality Date  . Tonsillectomy    . Inguinal hernia repair  1987    bilateral       The following portions of the patient's history were reviewed and updated as appropriate: Allergies, current medications, and problem list.    Review of Systems:   Patient denies headache, fevers, malaise, unintentional weight loss, skin rash, eye pain, sinus congestion and sinus pain, sore throat, dysphagia,  hemoptysis , cough, dyspnea, wheezing, chest pain, palpitations, orthopnea, edema, abdominal pain, nausea, melena, diarrhea, constipation, flank pain, dysuria, hematuria, urinary  Frequency, nocturia, numbness, tingling, seizures,  Focal weakness, Loss of consciousness,  Tremor, insomnia, depression, anxiety, and suicidal ideation.     History   Social History  .  Marital Status: Married    Spouse Name: N/A    Number of Children: N/A  . Years of Education: N/A   Occupational History  . CONSTRUCTION     Full time   Social History Main Topics  . Smoking status: Former Games developermoker  . Smokeless tobacco: Former NeurosurgeonUser    Types: Snuff  . Alcohol Use: Yes     Comment: Occasional  . Drug Use: No  . Sexual Activity: Yes   Other Topics Concern  . Not on file   Social History Narrative   Married   Does not get regular exercise    Objective:  Filed Vitals:   01/19/14 1017  BP: 156/82  Pulse: 68  Temp: 98.6 F (37 C)  Resp: 18     General appearance: alert, cooperative and appears stated age Ears: normal TM's and external ear canals both ears Throat: lips, mucosa, and tongue normal; teeth and gums normal Neck: no adenopathy, no carotid bruit, supple, symmetrical, trachea midline and thyroid not enlarged, symmetric, no tenderness/mass/nodules Back: symmetric, no curvature. ROM normal. No CVA tenderness. Lungs: clear to auscultation bilaterally Heart: regular rate and rhythm, S1, S2 normal, no murmur, click, rub or gallop Abdomen: soft, non-tender; bowel sounds normal; no masses,  no organomegaly Pulses: 2+ and symmetric Skin: Skin color, texture, turgor normal. No rashes or lesions Lymph nodes: Cervical, supraclavicular, and axillary nodes normal.  Assessment and Plan:  HYPERTENSION, BENIGN Not well controlled on current regimen. checking urine for protein and Renal function.  changing lisinopril to losartan .  Lab Results  Component Value Date   MICROALBUR 0.4 01/19/2014      Type II or unspecified type diabetes mellitus without mention of complication, not stated as uncontrolled He has reduced his A1c to 6.0 as well as his  fasting glucose.  Despite hypertension has no proteinuria.  Foot exam normal.  Eye exam up to date.   Lab Results  Component Value Date   HGBA1C 6.0 01/19/2014     HYPERLIPIDEMIA-MIXED Markedly improved  with diet and exercise.    Lab Results  Component Value Date   CHOL 165 01/19/2014   CHOL 179 07/06/2013   CHOL 181 12/31/2012   Lab Results  Component Value Date   HDL 37.30* 01/19/2014   HDL 34.70* 07/06/2013   HDL 34.50* 12/31/2012   Lab Results  Component Value Date   LDLCALC 99 01/19/2014   Lab Results  Component Value Date   TRIG 145.0 01/19/2014   TRIG 233.0* 07/06/2013   TRIG 269.0* 12/31/2012   Lab Results  Component Value Date   CHOLHDL 4 01/19/2014   CHOLHDL 5 07/06/2013   CHOLHDL 5 12/31/2012   Lab Results  Component Value Date   LDLDIRECT 103.0 07/06/2013   LDLDIRECT 96.2 12/31/2012    Obesity, unspecified I have congratulated him in reduction of   BMI and weight loss of 11 lbs over the past year and encouraged  Continued weight loss with goal of 10% of body weight over the next 6 months using a low glycemic index diet and regular exercise a minimum of 5 days per week.     Updated Medication List Outpatient Encounter Prescriptions as of 01/19/2014  Medication Sig  . Cholecalciferol (VITAMIN D) 2000 UNITS tablet Take 2,000 Units by mouth daily.  Marland Kitchen. losartan (COZAAR) 100 MG tablet Take 1 tablet (100 mg total) by mouth daily.  . metoprolol succinate (TOPROL-XL) 50 MG 24 hr tablet TAKE ONE TABLET BY MOUTH ONCE DAILY. TAKE WITH OR IMMEDIATELY FOLLOWING A MEAL  . ranitidine (ZANTAC) 150 MG tablet Take 150 mg by mouth daily.    . [DISCONTINUED] losartan (COZAAR) 50 MG tablet Take 1 tablet (50 mg total) by mouth daily.  . fish oil-omega-3 fatty acids 1000 MG capsule Take 2 g by mouth daily.     Orders Placed This Encounter  Procedures  . Hemoglobin A1c  . Lipid panel  . Microalbumin / creatinine urine ratio  . Comprehensive metabolic panel    Return in about 6 months (around 07/21/2014).

## 2014-02-24 ENCOUNTER — Encounter: Payer: Self-pay | Admitting: Internal Medicine

## 2014-02-27 MED ORDER — VALSARTAN 80 MG PO TABS: 80.0000 mg | ORAL_TABLET | Freq: Every day | ORAL | Status: DC

## 2014-02-27 NOTE — Addendum Note (Signed)
Addended by: Sherlene ShamsULLO, Lum Stillinger L on: 02/27/2014 07:37 PM   Modules accepted: Orders, Medications

## 2014-03-11 ENCOUNTER — Other Ambulatory Visit: Payer: Self-pay | Admitting: *Deleted

## 2014-03-11 MED ORDER — METOPROLOL SUCCINATE ER 50 MG PO TB24: ORAL_TABLET | ORAL | Status: DC

## 2014-03-15 ENCOUNTER — Encounter: Payer: Self-pay | Admitting: *Deleted

## 2014-03-27 ENCOUNTER — Encounter: Payer: Self-pay | Admitting: Internal Medicine

## 2014-04-05 NOTE — Telephone Encounter (Signed)
Please advise 

## 2014-04-20 ENCOUNTER — Telehealth: Payer: Self-pay | Admitting: *Deleted

## 2014-04-20 NOTE — Telephone Encounter (Signed)
VM left for pt about nurse visit BP recheck for DB 

## 2014-04-21 ENCOUNTER — Telehealth: Payer: Self-pay | Admitting: Internal Medicine

## 2014-04-26 ENCOUNTER — Other Ambulatory Visit: Payer: Self-pay | Admitting: Internal Medicine

## 2014-05-30 ENCOUNTER — Other Ambulatory Visit: Payer: Self-pay | Admitting: Internal Medicine

## 2014-06-26 ENCOUNTER — Other Ambulatory Visit: Payer: Self-pay | Admitting: Internal Medicine

## 2014-07-11 ENCOUNTER — Emergency Department: Payer: Self-pay | Admitting: Emergency Medicine

## 2014-07-12 LAB — BASIC METABOLIC PANEL
Anion Gap: 11 (ref 7–16)
BUN: 16 mg/dL (ref 7–18)
CALCIUM: 8.1 mg/dL — AB (ref 8.5–10.1)
Chloride: 102 mmol/L (ref 98–107)
Co2: 28 mmol/L (ref 21–32)
Creatinine: 0.89 mg/dL (ref 0.60–1.30)
EGFR (African American): >60
EGFR (Non-African Amer.): >60
Glucose: 170 mg/dL — ABNORMAL HIGH (ref 65–99)
Osmolality: 286 (ref 275–301)
Potassium: 3.8 mmol/L (ref 3.5–5.1)
Sodium: 141 mmol/L (ref 136–145)

## 2014-07-12 LAB — CBC
HCT: 44.7 % (ref 40.0–52.0)
HGB: 15.3 g/dL (ref 13.0–18.0)
MCH: 29.8 pg (ref 26.0–34.0)
MCHC: 34.1 g/dL (ref 32.0–36.0)
MCV: 87 fL (ref 80–100)
Platelet: 241 10*3/uL (ref 150–440)
RBC: 5.12 10*6/uL (ref 4.40–5.90)
RDW: 13.3 % (ref 11.5–14.5)
WBC: 11.7 10*3/uL — ABNORMAL HIGH (ref 3.8–10.6)

## 2014-07-12 LAB — LIPASE, BLOOD: LIPASE: 142 U/L (ref 73–393)

## 2014-07-12 LAB — TROPONIN I
Troponin-I: <0.02 ng/mL
Troponin-I: <0.02 ng/mL

## 2014-07-18 ENCOUNTER — Encounter: Payer: Self-pay | Admitting: Internal Medicine

## 2014-07-18 ENCOUNTER — Ambulatory Visit (INDEPENDENT_AMBULATORY_CARE_PROVIDER_SITE_OTHER): Payer: BC Managed Care – PPO | Admitting: *Deleted

## 2014-07-18 ENCOUNTER — Ambulatory Visit (INDEPENDENT_AMBULATORY_CARE_PROVIDER_SITE_OTHER): Payer: BC Managed Care – PPO | Admitting: Internal Medicine

## 2014-07-18 VITALS — BP 154/88 | HR 68 | Temp 98.3°F | Resp 14 | Ht 71.25 in | Wt 253.5 lb

## 2014-07-18 DIAGNOSIS — E1169 Type 2 diabetes mellitus with other specified complication: Secondary | ICD-10-CM

## 2014-07-18 DIAGNOSIS — K2100 Gastro-esophageal reflux disease with esophagitis, without bleeding: Secondary | ICD-10-CM

## 2014-07-18 DIAGNOSIS — E663 Overweight: Secondary | ICD-10-CM

## 2014-07-18 DIAGNOSIS — Z0000 Encounter for general adult medical examination without abnormal findings: Secondary | ICD-10-CM | POA: Insufficient documentation

## 2014-07-18 DIAGNOSIS — Z125 Encounter for screening for malignant neoplasm of prostate: Secondary | ICD-10-CM

## 2014-07-18 DIAGNOSIS — E119 Type 2 diabetes mellitus without complications: Secondary | ICD-10-CM

## 2014-07-18 DIAGNOSIS — I10 Essential (primary) hypertension: Secondary | ICD-10-CM

## 2014-07-18 DIAGNOSIS — E669 Obesity, unspecified: Secondary | ICD-10-CM

## 2014-07-18 DIAGNOSIS — Z23 Encounter for immunization: Secondary | ICD-10-CM

## 2014-07-18 DIAGNOSIS — E785 Hyperlipidemia, unspecified: Secondary | ICD-10-CM

## 2014-07-18 DIAGNOSIS — Z1211 Encounter for screening for malignant neoplasm of colon: Secondary | ICD-10-CM

## 2014-07-18 LAB — MICROALBUMIN / CREATININE URINE RATIO
Creatinine,U: 137.4 mg/dL
MICROALB/CREAT RATIO: 0.4 mg/g (ref 0.0–30.0)
Microalb, Ur: 0.5 mg/dL (ref 0.0–1.9)

## 2014-07-18 LAB — LIPID PANEL
CHOLESTEROL: 194 mg/dL (ref 0–200)
HDL: 35.9 mg/dL — ABNORMAL LOW (ref >39.00)
NonHDL: 158.1
Total CHOL/HDL Ratio: 5
Triglycerides: 276 mg/dL — ABNORMAL HIGH (ref 0.0–149.0)
VLDL: 55.2 mg/dL — ABNORMAL HIGH (ref 0.0–40.0)

## 2014-07-18 LAB — HEPATIC FUNCTION PANEL
ALBUMIN: 4.6 g/dL (ref 3.5–5.2)
ALK PHOS: 58 U/L (ref 39–117)
ALT: 29 U/L (ref 0–53)
AST: 21 U/L (ref 0–37)
Bilirubin, Direct: 0.1 mg/dL (ref 0.0–0.3)
TOTAL PROTEIN: 7.5 g/dL (ref 6.0–8.3)
Total Bilirubin: 0.9 mg/dL (ref 0.2–1.2)

## 2014-07-18 LAB — HEMOGLOBIN A1C: HEMOGLOBIN A1C: 6.3 % (ref 4.6–6.5)

## 2014-07-18 LAB — TSH: TSH: 2.13 u[IU]/mL (ref 0.35–4.50)

## 2014-07-18 LAB — PSA: PSA: 0.2 ng/mL (ref 0.10–4.00)

## 2014-07-18 MED ORDER — VALSARTAN-HYDROCHLOROTHIAZIDE 80-12.5 MG PO TABS: 1.0000 | ORAL_TABLET | Freq: Every day | ORAL | Status: DC

## 2014-07-18 MED ORDER — RANITIDINE HCL 150 MG PO TABS: 150.0000 mg | ORAL_TABLET | Freq: Two times a day (BID) | ORAL | Status: DC

## 2014-07-18 NOTE — Assessment & Plan Note (Signed)
Markedly improved in June with diet and exercise.  Repeat lipids are due  Lab Results  Component Value Date   CHOL 165 01/19/2014   CHOL 179 07/06/2013   CHOL 181 12/31/2012   Lab Results  Component Value Date   HDL 37.30* 01/19/2014   HDL 34.70* 07/06/2013   HDL 34.50* 12/31/2012   Lab Results  Component Value Date   LDLCALC 99 01/19/2014   Lab Results  Component Value Date   TRIG 145.0 01/19/2014   TRIG 233.0* 07/06/2013   TRIG 269.0* 12/31/2012   Lab Results  Component Value Date   CHOLHDL 4 01/19/2014   CHOLHDL 5 07/06/2013   CHOLHDL 5 12/31/2012   Lab Results  Component Value Date   LDLDIRECT 103.0 07/06/2013   LDLDIRECT 96.2 12/31/2012

## 2014-07-18 NOTE — Assessment & Plan Note (Signed)
Annual male exam was done including prostate exam .  Screening for STDS and depressive symptoms was negative. Vaccines were brought up to date.  Healthy habits were reviewed including use of seatbelts 100% of the time , moderate alcohol consumption, regular exercise, and the  mediterranean diet. g .  Colon ca screening was reviewed and options given.  He has deferred colonoscopy and will return when the FOBTS are available.

## 2014-07-18 NOTE — Progress Notes (Signed)
Pre-visit discussion using our clinic review tool. No additional management support is needed unless otherwise documented below in the visit note.  

## 2014-07-18 NOTE — Assessment & Plan Note (Signed)
Complicated by obesity.  At last visit he had reduced his A1c to 6.0 as well as his  fasting glucose.  Despite hypertension has no proteinuria.  Foot exam normal.  Eye exam up to date.    Lab Results  Component Value Date   HGBA1C 6.0 01/19/2014    Lab Results  Component Value Date   MICROALBUR 0.4 01/19/2014

## 2014-07-18 NOTE — Assessment & Plan Note (Signed)
Elevated today.  Reviewed list of meds, patient is not taking OTC meds that could be causing it.  Adding 12.5 mg hHCTZ to valsartan,  Have asked patient to recheck bp at home a minimum of 5 times over the next 4 weeks and call readings to office for adjustment of medications.

## 2014-07-18 NOTE — Assessment & Plan Note (Signed)
I have addressed  BMI and recommended wt loss of 10% of body weigh over the next 6 months using a low glycemic index diet and regular exercise a minimum of 5 days per week.   

## 2014-07-18 NOTE — Progress Notes (Signed)
Patient ID: Alec Gibsononnie Ray Clyne Jr., male   DOB: Feb 02, 1961, 53 y.o.   MRN: [id number]   The patient is here for his annual male preventive examination and management of other chronic and acute problems.   The risk factors are reflected in the social history.  The roster of all physicians providing medical care to patient - is listed in the Snapshot section of the chart.   Home safety : The patient has smoke detectors in the home. He wears seatbelts.  There are registered  firearms at home. There is no violence in the home.   There is no risks for hepatitis, STDs or HIV. There is no   history of blood transfusion. There is no travel history to infectious disease endemic areas of the world.  The patient has seen their dentist in the last six month and  their eye doctor in the last year.  They do not  have excessive sun exposure. They have seen a dermatoloigist in the last year. Discussed the need for sun protection: hats, long sleeves and use of sunscreen if there is significant sun exposure.   Diet: the importance of a healthy diet is discussed. They do have a healthy diet.  The benefits of regular aerobic exercise were discussed. He exercises a minimum of 30 minutes  5 days per week.  Depression screen: there are no signs or vegative symptoms of depression- irritability, change in appetite, anhedonia, sadness/tearfullness.  The following portions of the patient's history were reviewed and updated as appropriate: allergies, current medications, past family history, past medical history,  past surgical history, past social history  and problem list.  Visual acuity was not assessed per patient preference since he has regular follow up with his ophthalmologist. Hearing and body mass index were assessed and reviewed.   During the course of the visit the patient was educated and counseled about appropriate screening and preventive services including :  nutrition counseling, colorectal cancer  screening, and recommended immunizations.    Objective:  BP 154/88 mmHg  Pulse 68  Temp(Src) 98.3 F (36.8 C) (Oral)  Resp 14  Ht 5' 11.25" (1.81 m)  Wt 253 lb 8 oz (114.987 kg)  BMI 35.10 kg/m2  SpO2 95%  General Appearance:    Alert, cooperative, no distress, appears stated age  Head:    Normocephalic, without obvious abnormality, atraumatic  Eyes:    PERRL, conjunctiva/corneas clear, EOM's intact, fundi    benign, both eyes       Ears:    Normal TM's and external ear canals, both ears  Nose:   Nares normal, septum midline, mucosa normal, no drainage   or sinus tenderness  Throat:   Lips, mucosa, and tongue normal; teeth and gums normal  Neck:   Supple, symmetrical, trachea midline, no adenopathy;       thyroid:  No enlargement/tenderness/nodules; no carotid   bruit or JVD  Back:     Symmetric, no curvature, ROM normal, no CVA tenderness  Lungs:     Clear to auscultation bilaterally, respirations unlabored  Chest wall:    No tenderness or deformity  Heart:    Regular rate and rhythm, S1 and S2 normal, no murmur, rub   or gallop  Abdomen:     Soft, non-tender, bowel sounds active all four quadrants,    no masses, no organomegaly  Genitalia:    Normal male without lesion, discharge or tenderness  Rectal:    Normal tone, normal prostate, no masses or tenderness;  guaiac negative stool  Extremities:   Extremities normal, atraumatic, no cyanosis or edema  Pulses:   2+ and symmetric all extremities  Skin:   Skin color, texture, turgor normal, no rashes or lesions  Lymph nodes:   Cervical, supraclavicular, and axillary nodes normal  Neurologic:   CNII-XII intact. Normal strength, sensation and reflexes      throughout   Assessment and Plan:  Problem List Items Addressed This Visit      Cardiovascular and Mediastinum   HYPERTENSION, BENIGN    Elevated today.  Reviewed list of meds, patient is not taking OTC meds that could be causing it.  Adding 12.5 mg hHCTZ to valsartan,   Have asked patient to recheck bp at home a minimum of 5 times over the next 4 weeks and call readings to office for adjustment of medications.      Relevant Medications      valsartan-hydrochlorothiazide (DIOVAN-HCT) 80-12.5 MG per tablet     Digestive   GERD (gastroesophageal reflux disease)    With recent episode of reflux esophagitis.Does not want PPI due to prior treatment failure.  Advised to use zantac 150 mg bid.     Relevant Medications      ranitidine (ZANTAC) tablet     Endocrine   Diabetes mellitus without complication    Complicated by obesity.  At last visit he had reduced his A1c to 6.0 as well as his  fasting glucose.  Despite hypertension has no proteinuria.  Foot exam normal.  Eye exam up to date.    Lab Results  Component Value Date   HGBA1C 6.0 01/19/2014    Lab Results  Component Value Date   MICROALBUR 0.4 01/19/2014       Relevant Medications      valsartan-hydrochlorothiazide (DIOVAN-HCT) 80-12.5 MG per tablet   Other Relevant Orders      Lipid panel      Hemoglobin A1c      Microalbumin / creatinine urine ratio      Hepatic function panel     Other   Hyperlipidemia associated with type 2 diabetes mellitus    Markedly improved in June with diet and exercise.  Repeat lipids are due  Lab Results  Component Value Date   CHOL 165 01/19/2014   CHOL 179 07/06/2013   CHOL 181 12/31/2012   Lab Results  Component Value Date   HDL 37.30* 01/19/2014   HDL 34.70* 07/06/2013   HDL 34.50* 12/31/2012   Lab Results  Component Value Date   LDLCALC 99 01/19/2014   Lab Results  Component Value Date   TRIG 145.0 01/19/2014   TRIG 233.0* 07/06/2013   TRIG 269.0* 12/31/2012   Lab Results  Component Value Date   CHOLHDL 4 01/19/2014   CHOLHDL 5 07/06/2013   CHOLHDL 5 12/31/2012   Lab Results  Component Value Date   LDLDIRECT 103.0 07/06/2013   LDLDIRECT 96.2 12/31/2012        Relevant Medications      valsartan-hydrochlorothiazide  (DIOVAN-HCT) 80-12.5 MG per tablet   Obesity    I have addressed  BMI and recommended wt loss of 10% of body weigh over the next 6 months using a low glycemic index diet and regular exercise a minimum of 5 days per week.      Visit for preventive health examination - Primary    Annual male exam was done including prostate exam .  Screening for STDS and depressive symptoms was negative. Vaccines were brought up to  date.  Healthy habits were reviewed including use of seatbelts 100% of the time , moderate alcohol consumption, regular exercise, and the  mediterranean diet. g .  Colon ca screening was reviewed and options given.  He has deferred colonoscopy and will return when the FOBTS are available.      Other Visit Diagnoses    Prostate cancer screening        Relevant Orders       PSA    Overweight        Relevant Orders       TSH    Colon cancer screening        Relevant Orders       Fecal occult blood, imunochemical

## 2014-07-18 NOTE — Addendum Note (Signed)
Addended by: Dennie BibleAVIS, Irene Collings R on: 07/18/2014 03:50 PM   Modules accepted: Orders

## 2014-07-18 NOTE — Assessment & Plan Note (Signed)
With recent episode of reflux esophagitis.Does not want PPI due to prior treatment failure.  Advised to use zantac 150 mg bid.

## 2014-07-18 NOTE — Patient Instructions (Addendum)
You had your annual  wellness exam today  You RECEIVED THE PNEUMONIA VACCINE TODAY   I recommend taking the zantac twice daily to prevent  any more episodes of reflux esophagitis/Gastritis  I have changed your BP medication to valsartan/hct .  Our goal for you blood pressure  Is 130-140/60 to 80  We will need for you to return to receive the home fecal occult blood test for your colon cancer screening   GO SEE DR Algis LimingRUMAN FOR YOUR EYE EXAM ASAP  Health Maintenance A healthy lifestyle and preventative care can promote health and wellness.  Maintain regular health, dental, and eye exams.  Eat a healthy diet. Foods like vegetables, fruits, whole grains, low-fat dairy products, and lean protein foods contain the nutrients you need and are low in calories. Decrease your intake of foods high in solid fats, added sugars, and salt. Get information about a proper diet from your health care provider, if necessary.  Regular physical exercise is one of the most important things you can do for your health. Most adults should get at least 150 minutes of moderate-intensity exercise (any activity that increases your heart rate and causes you to sweat) each week. In addition, most adults need muscle-strengthening exercises on 2 or more days a week.   Maintain a healthy weight. The body mass index (BMI) is a screening tool to identify possible weight problems. It provides an estimate of body fat based on height and weight. Your health care provider can find your BMI and can help you achieve or maintain a healthy weight. For males 20 years and older:  A BMI below 18.5 is considered underweight.  A BMI of 18.5 to 24.9 is normal.  A BMI of 25 to 29.9 is considered overweight.  A BMI of 30 and above is considered obese.  Maintain normal blood lipids and cholesterol by exercising and minimizing your intake of saturated fat. Eat a balanced diet with plenty of fruits and vegetables. Blood tests for lipids and  cholesterol should begin at age 51 and be repeated every 5 years. If your lipid or cholesterol levels are high, you are over age 51, or you are at high risk for heart disease, you may need your cholesterol levels checked more frequently.Ongoing high lipid and cholesterol levels should be treated with medicines if diet and exercise are not working.  If you smoke, find out from your health care provider how to quit. If you do not use tobacco, do not start.  Lung cancer screening is recommended for adults aged 55-80 years who are at high risk for developing lung cancer because of a history of smoking. A yearly low-dose CT scan of the lungs is recommended for people who have at least a 30-pack-year history of smoking and are current smokers or have quit within the past 15 years. A pack year of smoking is smoking an average of 1 pack of cigarettes a day for 1 year (for example, a 30-pack-year history of smoking could mean smoking 1 pack a day for 30 years or 2 packs a day for 15 years). Yearly screening should continue until the smoker has stopped smoking for at least 15 years. Yearly screening should be stopped for people who develop a health problem that would prevent them from having lung cancer treatment.  If you choose to drink alcohol, do not have more than 2 drinks per day. One drink is considered to be 12 oz (360 mL) of beer, 5 oz (150 mL) of wine,  or 1.5 oz (45 mL) of liquor.  Avoid the use of street drugs. Do not share needles with anyone. Ask for help if you need support or instructions about stopping the use of drugs.  High blood pressure causes heart disease and increases the risk of stroke. Blood pressure should be checked at least every 1-2 years. Ongoing high blood pressure should be treated with medicines if weight loss and exercise are not effective.  If you are 75-90 years old, ask your health care provider if you should take aspirin to prevent heart disease.  Diabetes screening involves  taking a blood sample to check your fasting blood sugar level. This should be done once every 3 years after age 41 if you are at a normal weight and without risk factors for diabetes. Testing should be considered at a younger age or be carried out more frequently if you are overweight and have at least 1 risk factor for diabetes.  Colorectal cancer can be detected and often prevented. Most routine colorectal cancer screening begins at the age of 46 and continues through age 83. However, your health care provider may recommend screening at an earlier age if you have risk factors for colon cancer. On a yearly basis, your health care provider may provide home test kits to check for hidden blood in the stool. A small camera at the end of a tube may be used to directly examine the colon (sigmoidoscopy or colonoscopy) to detect the earliest forms of colorectal cancer. Talk to your health care provider about this at age 40 when routine screening begins. A direct exam of the colon should be repeated every 5-10 years through age 45, unless early forms of precancerous polyps or small growths are found.  People who are at an increased risk for hepatitis B should be screened for this virus. You are considered at high risk for hepatitis B if:  You were born in a country where hepatitis B occurs often. Talk with your health care provider about which countries are considered high risk.  Your parents were born in a high-risk country and you have not received a shot to protect against hepatitis B (hepatitis B vaccine).  You have HIV or AIDS.  You use needles to inject street drugs.  You live with, or have sex with, someone who has hepatitis B.  You are a man who has sex with other men (MSM).  You get hemodialysis treatment.  You take certain medicines for conditions like cancer, organ transplantation, and autoimmune conditions.  Hepatitis C blood testing is recommended for all people born from [birth year] through 1963  and any individual with known risk factors for hepatitis C.  Healthy men should no longer receive prostate-specific antigen (PSA) blood tests as part of routine cancer screening. Talk to your health care provider about prostate cancer screening.  Testicular cancer screening is not recommended for adolescents or adult males who have no symptoms. Screening includes self-exam, a health care provider exam, and other screening tests. Consult with your health care provider about any symptoms you have or any concerns you have about testicular cancer.  Practice safe sex. Use condoms and avoid high-risk sexual practices to reduce the spread of sexually transmitted infections (STIs).  You should be screened for STIs, including gonorrhea and chlamydia if:  You are sexually active and are younger than 24 years.  You are older than 24 years, and your health care provider tells you that you are at risk for this type of infection.  Your sexual activity has changed since you were last screened, and you are at an increased risk for chlamydia or gonorrhea. Ask your health care provider if you are at risk.  If you are at risk of being infected with HIV, it is recommended that you take a prescription medicine daily to prevent HIV infection. This is called pre-exposure prophylaxis (PrEP). You are considered at risk if:  You are a man who has sex with other men (MSM).  You are a heterosexual man who is sexually active with multiple partners.  You take drugs by injection.  You are sexually active with a partner who has HIV.  Talk with your health care provider about whether you are at high risk of being infected with HIV. If you choose to begin PrEP, you should first be tested for HIV. You should then be tested every 3 months for as long as you are taking PrEP.  Use sunscreen. Apply sunscreen liberally and repeatedly throughout the day. You should seek shade when your shadow is shorter than you. Protect  yourself by wearing long sleeves, pants, a wide-brimmed hat, and sunglasses year round whenever you are outdoors.  Tell your health care provider of new moles or changes in moles, especially if there is a change in shape or color. Also, tell your health care provider if a mole is larger than the size of a pencil eraser.  A one-time screening for abdominal aortic aneurysm (AAA) and surgical repair of large AAAs by ultrasound is recommended for men aged 65-75 years who are current or former smokers.  Stay current with your vaccines (immunizations). Document Released: 01/18/2008 Document Revised: 07/27/2013 Document Reviewed: 12/17/2010 [facility] Patient Information 2015 [employer] [place]. This information is not intended to replace advice given to you by your health care provider. Make sure you discuss any questions you have with your health care provider.

## 2014-07-19 ENCOUNTER — Telehealth: Payer: Self-pay | Admitting: Internal Medicine

## 2014-07-19 LAB — LDL CHOLESTEROL, DIRECT: Direct LDL: 114.6 mg/dL

## 2014-07-19 NOTE — Telephone Encounter (Signed)
emmi emailed °

## 2014-07-21 ENCOUNTER — Encounter: Payer: Self-pay | Admitting: Internal Medicine

## 2014-08-29 ENCOUNTER — Ambulatory Visit (INDEPENDENT_AMBULATORY_CARE_PROVIDER_SITE_OTHER): Payer: BLUE CROSS/BLUE SHIELD | Admitting: Nurse Practitioner

## 2014-08-29 ENCOUNTER — Ambulatory Visit (INDEPENDENT_AMBULATORY_CARE_PROVIDER_SITE_OTHER)
Admission: RE | Admit: 2014-08-29 | Discharge: 2014-08-29 | Disposition: A | Discharge status: Home / Self Care | Payer: BLUE CROSS/BLUE SHIELD | Source: Ambulatory Visit | Attending: Nurse Practitioner | Admitting: Nurse Practitioner

## 2014-08-29 ENCOUNTER — Encounter: Payer: Self-pay | Admitting: Nurse Practitioner

## 2014-08-29 VITALS — BP 160/80 | HR 99 | Temp 100.9°F | Resp 14 | Ht 71.0 in | Wt 257.1 lb

## 2014-08-29 DIAGNOSIS — R05 Cough: Secondary | ICD-10-CM

## 2014-08-29 DIAGNOSIS — J019 Acute sinusitis, unspecified: Secondary | ICD-10-CM

## 2014-08-29 DIAGNOSIS — R058 Other specified cough: Secondary | ICD-10-CM

## 2014-08-29 DIAGNOSIS — J029 Acute pharyngitis, unspecified: Secondary | ICD-10-CM

## 2014-08-29 MED ORDER — LEVOFLOXACIN 750 MG PO TABS: 750.0000 mg | ORAL_TABLET | Freq: Every day | ORAL | Status: DC

## 2014-08-29 NOTE — Progress Notes (Signed)
Pre visit review using our clinic review tool, if applicable. No additional management support is needed unless otherwise documented below in the visit note. 

## 2014-08-29 NOTE — Patient Instructions (Addendum)
[facility].   Call us if worsening/failure to improve with the antibiotics.   We will contact you with your results.   Continue the mucinex and stay hydrated  Please take a probiotic ( Align, Floraque or Culturelle) while you are on the antibiotic to prevent a serious antibiotic associated diarrhea  Called clostirudium dificile colitis.

## 2014-08-29 NOTE — Progress Notes (Signed)
Subjective:    Patient ID: Alec Ali., male    DOB: [birth year], 54 y.o.   MRN: [id number]  HPI  Alec Ali is a 54 yo male with a CC of cough x 4 days with phlegm and a history of pneumonia in the past.   1) 3 times in past, once in high school, 1984 or 85, and 89-90.   Fever, productive cough with green/brown, rhinorrhea with green discharge, No recent travel No recent Abx use Been exposed to wife with sinus pressure and brother with illness Pt has been working int he cold   Takes advil- helpful  Mucinex twice daily- helpful Coricidin liquid form- helpful   Review of Systems  Constitutional: Positive for fever and fatigue. Negative for chills, diaphoresis and unexpected weight change.  HENT: Positive for congestion, rhinorrhea, sinus pressure and sore throat. Negative for ear discharge and ear pain.   Respiratory: Positive for cough. Negative for chest tightness, shortness of breath and wheezing.   Cardiovascular: Negative for chest pain, palpitations and leg swelling.  Gastrointestinal: Negative for nausea, vomiting and diarrhea.  Musculoskeletal: Negative for myalgias.  Skin: Negative for rash.  Neurological: Positive for headaches. Negative for dizziness and light-headedness.   Past Medical History  Diagnosis Date  . Hypertension   . GERD (gastroesophageal reflux disease)     EGD normal 08/08/2011 [place]     History   Social History  . Marital Status: Married    Spouse Name: N/A    Number of Children: N/A  . Years of Education: N/A   Occupational History  . CONSTRUCTION     Full time   Social History Main Topics  . Smoking status: Former Games developer  . Smokeless tobacco: Former Neurosurgeon    Types: Snuff  . Alcohol Use: Yes     Comment: Occasional  . Drug Use: No  . Sexual Activity: Yes   Other Topics Concern  . Not on file   Social History Narrative   Married   Does not get regular exercise    Past Surgical History  Procedure Laterality Date  .  Tonsillectomy    . Inguinal hernia repair  1987    bilateral    Family History  Problem Relation Age of Onset  . Breast cancer Mother   . Diabetes Mother   . Thyroid disease Mother   . Hypertension Father   . COPD Father   . Colon cancer Maternal Grandmother 70    No Known Allergies  Current Outpatient Prescriptions on File Prior to Visit  Medication Sig Dispense Refill  . Cholecalciferol (VITAMIN D) 2000 UNITS tablet Take 2,000 Units by mouth daily.    . fish oil-omega-3 fatty acids 1000 MG capsule Take 2 g by mouth daily.    . metoprolol succinate (TOPROL-XL) 50 MG 24 hr tablet TAKE ONE TABLET BY MOUTH ONCE DAILY. TAKE WITH OR IMMEDIATELY FOLLOWING A MEAL 30 tablet 5  . ranitidine (ZANTAC) 150 MG tablet Take 1 tablet (150 mg total) by mouth 2 (two) times daily. 180 tablet   . valsartan-hydrochlorothiazide (DIOVAN-HCT) 80-12.5 MG per tablet Take 1 tablet by mouth daily. 90 tablet 1   No current facility-administered medications on file prior to visit.      Objective:   Physical Exam  Constitutional: He is oriented to person, place, and time. He appears well-developed and well-nourished. No distress.  BP 160/80 mmHg  Pulse 99  Temp(Src) 100.9 F (38.3 C) (Oral)  Resp 14  Ht  (  1.803 m)  Wt 257 lb 1.9 oz (116.629 kg)  BMI 35.88 kg/m2  SpO2 95%   HENT:  Head: Normocephalic and atraumatic.  Right Ear: External ear normal.  Left Ear: External ear normal.  TM's clear bilaterally, Throat is very red and irritated, no exudates seen on exam  Eyes: Conjunctivae and EOM are normal. Pupils are equal, round, and reactive to light. Right eye exhibits no discharge. Left eye exhibits no discharge. No scleral icterus.  Neck: Normal range of motion. Neck supple. No thyromegaly present.  Cardiovascular: Normal rate and regular rhythm.   Pulmonary/Chest: Effort normal and breath sounds normal. No respiratory distress. He has no wheezes. He has no rales. He exhibits no tenderness.    Lymphadenopathy:    He has no cervical adenopathy.  Neurological: He is alert and oriented to person, place, and time. Coordination normal.  Skin: Skin is warm and dry. No rash noted. He is not diaphoretic.  Psychiatric: He has a normal mood and affect. His behavior is normal. Judgment and thought content normal.      Assessment & Plan:

## 2014-08-31 DIAGNOSIS — J019 Acute sinusitis, unspecified: Secondary | ICD-10-CM | POA: Insufficient documentation

## 2014-08-31 NOTE — Assessment & Plan Note (Signed)
Worsening. Strep A has been found frequently in the adult community this season, Rapid Strep test was negative. Will obtain Chest X-ray to rule out pneumonia. Mucinex plain was encouraged. Ordered Levofloxacin in case of pneumonia diagnosis.

## 2014-09-09 ENCOUNTER — Other Ambulatory Visit: Payer: Self-pay | Admitting: Internal Medicine

## 2014-10-14 ENCOUNTER — Other Ambulatory Visit: Payer: BC Managed Care – PPO

## 2014-10-17 ENCOUNTER — Ambulatory Visit: Payer: BC Managed Care – PPO | Admitting: Internal Medicine

## 2015-01-09 ENCOUNTER — Ambulatory Visit (INDEPENDENT_AMBULATORY_CARE_PROVIDER_SITE_OTHER): Payer: BLUE CROSS/BLUE SHIELD | Admitting: Internal Medicine

## 2015-01-09 VITALS — BP 150/78 | HR 58 | Temp 98.2°F | Resp 18 | Ht 71.0 in | Wt 248.0 lb

## 2015-01-09 DIAGNOSIS — E1169 Type 2 diabetes mellitus with other specified complication: Secondary | ICD-10-CM

## 2015-01-09 DIAGNOSIS — I10 Essential (primary) hypertension: Secondary | ICD-10-CM | POA: Diagnosis not present

## 2015-01-09 DIAGNOSIS — E785 Hyperlipidemia, unspecified: Secondary | ICD-10-CM | POA: Diagnosis not present

## 2015-01-09 DIAGNOSIS — L84 Corns and callosities: Secondary | ICD-10-CM | POA: Insufficient documentation

## 2015-01-09 DIAGNOSIS — E669 Obesity, unspecified: Secondary | ICD-10-CM

## 2015-01-09 DIAGNOSIS — E119 Type 2 diabetes mellitus without complications: Secondary | ICD-10-CM

## 2015-01-09 DIAGNOSIS — B070 Plantar wart: Secondary | ICD-10-CM

## 2015-01-09 LAB — COMPREHENSIVE METABOLIC PANEL
ALT: 27 U/L (ref 0–53)
AST: 26 U/L (ref 0–37)
Albumin: 4.8 g/dL (ref 3.5–5.2)
Alkaline Phosphatase: 57 U/L (ref 39–117)
BILIRUBIN TOTAL: 0.9 mg/dL (ref 0.2–1.2)
BUN: 12 mg/dL (ref 6–23)
CALCIUM: 9.6 mg/dL (ref 8.4–10.5)
CHLORIDE: 98 meq/L (ref 96–112)
CO2: 28 meq/L (ref 19–32)
Creatinine, Ser: 0.82 mg/dL (ref 0.40–1.50)
GFR: 105.1 mL/min (ref >60.00)
Glucose, Bld: 90 mg/dL (ref 70–99)
POTASSIUM: 3.8 meq/L (ref 3.5–5.1)
Sodium: 135 mEq/L (ref 135–145)
TOTAL PROTEIN: 7.5 g/dL (ref 6.0–8.3)

## 2015-01-09 LAB — LIPID PANEL
Cholesterol: 188 mg/dL (ref 0–200)
HDL: 40.8 mg/dL (ref >39.00)
LDL Cholesterol: 120 mg/dL — ABNORMAL HIGH (ref 0–99)
NonHDL: 147.2
TRIGLYCERIDES: 138 mg/dL (ref 0.0–149.0)
Total CHOL/HDL Ratio: 5
VLDL: 27.6 mg/dL (ref 0.0–40.0)

## 2015-01-09 LAB — TSH: TSH: 1.98 u[IU]/mL (ref 0.35–4.50)

## 2015-01-09 MED ORDER — AMLODIPINE BESYLATE 2.5 MG PO TABS: 2.5000 mg | ORAL_TABLET | Freq: Every day | ORAL | Status: DC

## 2015-01-09 NOTE — Assessment & Plan Note (Addendum)
I have addressed  BMI and recommended a low glycemic index diet utilizing smaller more frequent meals to increase metabolism.  I have also recommended that patient start exercising with a goal of 30 minutes of aerobic exercise a minimum of 5 days per week. He prefers to try a commerical product that  Have reviewed and appears to be comprised of herbal ingredients.

## 2015-01-09 NOTE — Progress Notes (Signed)
Subjective:  Patient ID: Alec Ali., male    DOB: 10-12-1960  Age: 54 y.o. MRN: [id number]  CC: The primary encounter diagnosis was Plantar wart of left foot. Diagnoses of Pre-ulcerative corn or callous, Essential hypertension, Hyperlipidemia, HYPERTENSION, BENIGN, Obesity, Diabetes mellitus without complication, and Hyperlipidemia associated with type 2 diabetes mellitus were also pertinent to this visit.  HPI Alec [employer]. presents for follow up on  Diabetes, hypertension and obesity.   HTN:  managed with metoprolol and diovan hct .  Last seen in january .  BP still high .  Home readings 140 to 150/70 to 80  Obesity:  Wants to try a commercial product to lose weight  Before September, his annual hunting trip to [place].  Foot pain: he has been shaving a callous on the plantar surface of his left foot, under the 5th tarsal head, but continues to have pain with weight bearing.    Outpatient Prescriptions Prior to Visit  Medication Sig Dispense Refill  . Cholecalciferol (VITAMIN D) 2000 UNITS tablet Take 2,000 Units by mouth daily.    . fish oil-omega-3 fatty acids 1000 MG capsule Take 2 g by mouth daily.    . metoprolol succinate (TOPROL-XL) 50 MG 24 hr tablet TAKE ONE TABLET BY MOUTH ONCE DAILY TAKE WITH OR IMMEDIATELY FOLLOWING A MEAL. 30 tablet 5  . ranitidine (ZANTAC) 150 MG tablet Take 1 tablet (150 mg total) by mouth 2 (two) times daily. 180 tablet   . valsartan-hydrochlorothiazide (DIOVAN-HCT) 80-12.5 MG per tablet Take 1 tablet by mouth daily. 90 tablet 1  . levofloxacin (LEVAQUIN) 750 MG tablet Take 1 tablet (750 mg total) by mouth daily. (Patient not taking: Reported on 01/09/2015) 5 tablet 0   No facility-administered medications prior to visit.    Review of Systems;  Patient denies headache, fevers, malaise, unintentional weight loss, skin rash, eye pain, sinus congestion and sinus pain, sore throat, dysphagia,  hemoptysis , cough, dyspnea, wheezing, chest  pain, palpitations, orthopnea, edema, abdominal pain, nausea, melena, diarrhea, constipation, flank pain, dysuria, hematuria, urinary  Frequency, nocturia, numbness, tingling, seizures,  Focal weakness, Loss of consciousness,  Tremor, insomnia, depression, anxiety, and suicidal ideation.      Objective:  BP 150/78 mmHg  Pulse 58  Temp(Src) 98.2 F (36.8 C)  Resp 18  Ht  (1.803 m)  Wt 248 lb (112.492 kg)  BMI 34.60 kg/m2  SpO2 99%  BP Readings from Last 3 Encounters:  01/09/15 150/78  08/29/14 160/80  07/18/14 154/88    Wt Readings from Last 3 Encounters:  01/09/15 248 lb (112.492 kg)  08/29/14 257 lb 1.9 oz (116.629 kg)  07/18/14 253 lb 8 oz (114.987 kg)    General appearance: alert, cooperative and appears stated age Ears: normal TM's and external ear canals both ears Throat: lips, mucosa, and tongue normal; teeth and gums normal Neck: no adenopathy, no carotid bruit, supple, symmetrical, trachea midline and thyroid not enlarged, symmetric, no tenderness/mass/nodules Back: symmetric, no curvature. ROM normal. No CVA tenderness. Lungs: clear to auscultation bilaterally Heart: regular rate and rhythm, S1, S2 normal, no murmur, click, rub or gallop Abdomen: soft, non-tender; bowel sounds normal; no masses,  no organomegaly Pulses: 2+ and symmetric Skin: Skin color, texture, turgor normal. No rashes or lesions Lymph nodes: Cervical, supraclavicular, and axillary nodes normal.  Lab Results  Component Value Date   HGBA1C 6.3 07/18/2014   HGBA1C 6.0 01/19/2014   HGBA1C 6.2 07/06/2013    Lab Results  Component  Value Date   CREATININE 0.82 01/09/2015   CREATININE 0.89 07/12/2014   CREATININE 0.8 01/19/2014    Lab Results  Component Value Date   WBC 11.7* 07/12/2014   HGB 15.3 07/12/2014   HCT 44.7 07/12/2014   PLT 241 07/12/2014   GLUCOSE 90 01/09/2015   CHOL 188 01/09/2015   TRIG 138.0 01/09/2015   HDL 40.80 01/09/2015   LDLDIRECT 114.6 07/18/2014    LDLCALC 120* 01/09/2015   ALT 27 01/09/2015   AST 26 01/09/2015   NA 135 01/09/2015   K 3.8 01/09/2015   CL 98 01/09/2015   CREATININE 0.82 01/09/2015   BUN 12 01/09/2015   CO2 28 01/09/2015   TSH 1.98 01/09/2015   PSA 0.20 07/18/2014   HGBA1C 6.3 07/18/2014   MICROALBUR 0.5 07/18/2014    Dg Chest 2 View  08/29/2014   CLINICAL DATA:  Productive cough, fever.  EXAM: CHEST  2 VIEW  COMPARISON:  July 12, 2014.  FINDINGS: The heart size and mediastinal contours are within normal limits. Both lungs are clear. No pneumothorax or pleural effusion is noted. The visualized skeletal structures are unremarkable.  IMPRESSION: No acute cardiopulmonary abnormality seen.   Electronically Signed   By: Roque Lias M.D.   On: 08/29/2014 12:16    Assessment & Plan:   Problem List Items Addressed This Visit    Hyperlipidemia associated with type 2 diabetes mellitus     improved  with diet and exercise.   Lab Results  Component Value Date   CHOL 188 01/09/2015   CHOL 194 07/18/2014   CHOL 165 01/19/2014   Lab Results  Component Value Date   HDL 40.80 01/09/2015   HDL 35.90* 07/18/2014   HDL 37.30* 01/19/2014   Lab Results  Component Value Date   LDLCALC 120* 01/09/2015   LDLCALC 99 01/19/2014   Lab Results  Component Value Date   TRIG 138.0 01/09/2015   TRIG 276.0* 07/18/2014   TRIG 145.0 01/19/2014   Lab Results  Component Value Date   CHOLHDL 5 01/09/2015   CHOLHDL 5 07/18/2014   CHOLHDL 4 01/19/2014   Lab Results  Component Value Date   LDLDIRECT 114.6 07/18/2014   LDLDIRECT 103.0 07/06/2013   LDLDIRECT 96.2 12/31/2012            Relevant Medications   amLODipine (NORVASC) 2.5 MG tablet   HYPERTENSION, BENIGN    ot well controlled on current regimen. Adding amlodipine 2.5 mg daily.       Relevant Medications   amLODipine (NORVASC) 2.5 MG tablet   Obesity    I have addressed  BMI and recommended a low glycemic index diet utilizing smaller more frequent  meals to increase metabolism.  I have also recommended that patient start exercising with a goal of 30 minutes of aerobic exercise a minimum of 5 days per week. He prefers to try a commerical product that  Have reviewed and appears to be comprised of herbal ingredients.       Diabetes mellitus without complication    Complicated by obesity.  He is managing DM with diet an dis due for A1c .  He  has no proteinuria.  Foot exam normal.  Eye exam up to date.    Lab Results  Component Value Date   HGBA1C 6.3 07/18/2014    Lab Results  Component Value Date   MICROALBUR 0.5 07/18/2014           Relevant Orders   Hemoglobin A1c  Microalbumin / creatinine urine ratio   Pre-ulcerative corn or callous    Referral to podiatry for management. May need orthotic to offload area.       Relevant Orders   Ambulatory referral to Podiatry    Other Visit Diagnoses    Plantar wart of left foot    -  Primary    Essential hypertension        Relevant Medications    amLODipine (NORVASC) 2.5 MG tablet    Other Relevant Orders    Comprehensive metabolic panel (Completed)    Hyperlipidemia        Relevant Medications    amLODipine (NORVASC) 2.5 MG tablet    Other Relevant Orders    TSH (Completed)    Lipid panel (Completed)      A total of 40 minutes was spent with patient more than half of which was spent in counseling patient on the above mentioned issues , reviewing and explaining recent labs and imaging studies done, and coordination of care.   I am having Mr. Cashen start on amLODipine. I am also having him maintain his fish oil-omega-3 fatty acids, Vitamin D, valsartan-hydrochlorothiazide, ranitidine, levofloxacin, and metoprolol succinate.  Meds ordered this encounter  Medications  . amLODipine (NORVASC) 2.5 MG tablet    Sig: Take 1 tablet (2.5 mg total) by mouth daily.    Dispense:  90 tablet    Refill:  3    There are no discontinued medications.  Follow-up: No Follow-up on  file.   Sherlene ShamsULLO, TERESA L, MD

## 2015-01-09 NOTE — Assessment & Plan Note (Signed)
ot well controlled on current regimen. Adding amlodipine 2.5 mg daily.

## 2015-01-09 NOTE — Patient Instructions (Addendum)
Your blood pressure is not at goal on your current medications. (Goal is < 130/80)   We are adding amlodipine 2.5 mg to take daily in the morning   Continue the metoprolol and the valsartan  Referral to Dr Hyatt[facility] to remove the painful callous/plantar wart on your left metatarsal head

## 2015-01-09 NOTE — Assessment & Plan Note (Signed)
Referral to podiatry for management. May need orthotic to offload area.

## 2015-01-09 NOTE — Assessment & Plan Note (Signed)
Complicated by obesity.  He is managing DM with diet an dis due for A1c .  He  has no proteinuria.  Foot exam normal.  Eye exam up to date.    Lab Results  Component Value Date   HGBA1C 6.3 07/18/2014    Lab Results  Component Value Date   MICROALBUR 0.5 07/18/2014

## 2015-01-09 NOTE — Progress Notes (Signed)
Pre visit review using our clinic review tool, if applicable. No additional management support is needed unless otherwise documented below in the visit note. 

## 2015-01-10 ENCOUNTER — Encounter: Payer: Self-pay | Admitting: Internal Medicine

## 2015-01-10 NOTE — Assessment & Plan Note (Signed)
improved  with diet and exercise.   Lab Results  Component Value Date   CHOL 188 01/09/2015   CHOL 194 07/18/2014   CHOL 165 01/19/2014   Lab Results  Component Value Date   HDL 40.80 01/09/2015   HDL 35.90* 07/18/2014   HDL 37.30* 01/19/2014   Lab Results  Component Value Date   LDLCALC 120* 01/09/2015   LDLCALC 99 01/19/2014   Lab Results  Component Value Date   TRIG 138.0 01/09/2015   TRIG 276.0* 07/18/2014   TRIG 145.0 01/19/2014   Lab Results  Component Value Date   CHOLHDL 5 01/09/2015   CHOLHDL 5 07/18/2014   CHOLHDL 4 01/19/2014   Lab Results  Component Value Date   LDLDIRECT 114.6 07/18/2014   LDLDIRECT 103.0 07/06/2013   LDLDIRECT 96.2 12/31/2012

## 2015-01-13 ENCOUNTER — Telehealth: Payer: Self-pay | Admitting: Internal Medicine

## 2015-01-13 NOTE — Telephone Encounter (Signed)
Spoke to [place], Rx is there for amlodipine and is ready for pickup. Pt notified.

## 2015-01-13 NOTE — Telephone Encounter (Signed)
Pt called stating that the rx Dr. Darrick Huntsman said she was going to call in has not been call yet. Pt could not remember the name of med. Please advise pt/

## 2015-01-19 ENCOUNTER — Other Ambulatory Visit: Payer: Self-pay | Admitting: Internal Medicine

## 2015-03-01 ENCOUNTER — Ambulatory Visit (INDEPENDENT_AMBULATORY_CARE_PROVIDER_SITE_OTHER): Payer: BLUE CROSS/BLUE SHIELD

## 2015-03-01 ENCOUNTER — Encounter: Payer: Self-pay | Admitting: Podiatry

## 2015-03-01 ENCOUNTER — Ambulatory Visit (INDEPENDENT_AMBULATORY_CARE_PROVIDER_SITE_OTHER): Payer: BLUE CROSS/BLUE SHIELD | Admitting: Podiatry

## 2015-03-01 VITALS — BP 156/82 | HR 71 | Resp 16

## 2015-03-01 DIAGNOSIS — E119 Type 2 diabetes mellitus without complications: Secondary | ICD-10-CM

## 2015-03-01 DIAGNOSIS — Q828 Other specified congenital malformations of skin: Secondary | ICD-10-CM | POA: Diagnosis not present

## 2015-03-01 NOTE — Progress Notes (Signed)
   Subjective:    Patient ID: Alec Ali., male    DOB: January 16, 1961, 54 y.o.   MRN: [id number]  HPI Comments: "I have a place on the bottom of my foot"  Patient c/o tender, small, callused area plantar/lateral left foot for 3 months. He started wearing a pair of new boots and noticed it starting up then. Doesn't remember an incident where he may have stepped on something. Active hiker. Really sore when hiking. Tried peeling skin away. No better.  Chart indicates patient is diabetic, but he states that he is not. He says at one time his A1c was elevated but has been normal range for awhile now.     Review of Systems  Musculoskeletal: Positive for back pain.  All other systems reviewed and are negative.      Objective:   Physical Exam: I have reviewed his past history medications allergies surgery social history and review of systems. Pulses are strongly palpable. Neurologic sensorium is intact versus once the monofilament. Deep tendon reflexes are intact bilateral muscle strength +5 over 5 dorsiflexion plantar flexors and inverters everters all intrinsic musculature is intact. Orthopedic evaluation shows all joints distal to the ankle for range of motion or crepitation. 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 Cutaneous evaluation demonstrates a solitary porokeratotic lesion plantar lateral aspect mid foot left. There are no signs of infection and that there is no erythema edema saline as drainage or odor. Porokeratotic lesions present. Radiographs do not demonstrate any type of osseus abnormalities.        Assessment & Plan:  Assessment: Porokeratotic lesion plantar aspect left foot. Diabetes mellitus with complication.  Plan: Discussed etiology pathology and servicer therapies mechanical distraction of the lesion today followed by chemical destruction of this lesion and we will follow-up with him in 6 weeks. Salicylic acid was placed  under occlusion to be left on for 3 days and then washed off. He will watch signs and symptoms of infection should any rise to notify us immediately.

## 2015-03-08 ENCOUNTER — Ambulatory Visit: Payer: Self-pay | Admitting: Podiatry

## 2015-03-11 ENCOUNTER — Other Ambulatory Visit: Payer: Self-pay | Admitting: Internal Medicine

## 2015-03-27 ENCOUNTER — Other Ambulatory Visit: Payer: Self-pay | Admitting: Internal Medicine

## 2015-04-24 ENCOUNTER — Ambulatory Visit: Payer: BLUE CROSS/BLUE SHIELD | Admitting: Podiatry

## 2015-07-10 ENCOUNTER — Other Ambulatory Visit: Payer: Self-pay | Admitting: Internal Medicine

## 2015-09-08 ENCOUNTER — Other Ambulatory Visit: Payer: Self-pay | Admitting: Internal Medicine

## 2015-10-13 ENCOUNTER — Other Ambulatory Visit: Payer: Self-pay | Admitting: Internal Medicine

## 2015-10-25 ENCOUNTER — Ambulatory Visit
Admission: RE | Admit: 2015-10-25 | Discharge: 2015-10-25 | Disposition: A | Discharge status: Home / Self Care | Payer: BLUE CROSS/BLUE SHIELD | Source: Ambulatory Visit | Attending: Internal Medicine | Admitting: Internal Medicine

## 2015-10-25 ENCOUNTER — Ambulatory Visit (INDEPENDENT_AMBULATORY_CARE_PROVIDER_SITE_OTHER): Payer: BLUE CROSS/BLUE SHIELD | Admitting: Internal Medicine

## 2015-10-25 ENCOUNTER — Encounter: Payer: Self-pay | Admitting: Internal Medicine

## 2015-10-25 VITALS — BP 152/80 | HR 72 | Temp 97.8°F | Resp 12 | Ht 71.0 in | Wt 245.5 lb

## 2015-10-25 DIAGNOSIS — M25561 Pain in right knee: Secondary | ICD-10-CM

## 2015-10-25 DIAGNOSIS — I10 Essential (primary) hypertension: Secondary | ICD-10-CM

## 2015-10-25 DIAGNOSIS — R7303 Prediabetes: Secondary | ICD-10-CM

## 2015-10-25 DIAGNOSIS — M25572 Pain in left ankle and joints of left foot: Secondary | ICD-10-CM

## 2015-10-25 DIAGNOSIS — G8929 Other chronic pain: Secondary | ICD-10-CM

## 2015-10-25 DIAGNOSIS — S93402D Sprain of unspecified ligament of left ankle, subsequent encounter: Secondary | ICD-10-CM | POA: Diagnosis not present

## 2015-10-25 DIAGNOSIS — E119 Type 2 diabetes mellitus without complications: Secondary | ICD-10-CM | POA: Diagnosis not present

## 2015-10-25 DIAGNOSIS — X58XXXD Exposure to other specified factors, subsequent encounter: Secondary | ICD-10-CM | POA: Insufficient documentation

## 2015-10-25 DIAGNOSIS — M1711 Unilateral primary osteoarthritis, right knee: Secondary | ICD-10-CM | POA: Insufficient documentation

## 2015-10-25 LAB — MICROALBUMIN / CREATININE URINE RATIO
Creatinine,U: 113.2 mg/dL
Microalb Creat Ratio: 0.6 mg/g (ref 0.0–30.0)
Microalb, Ur: <0.7 mg/dL (ref 0.0–1.9)

## 2015-10-25 LAB — LIPID PANEL
CHOL/HDL RATIO: 5
CHOLESTEROL: 162 mg/dL (ref 0–200)
HDL: 34.3 mg/dL — AB (ref >39.00)
NonHDL: 127.7
TRIGLYCERIDES: 244 mg/dL — AB (ref 0.0–149.0)
VLDL: 48.8 mg/dL — AB (ref 0.0–40.0)

## 2015-10-25 LAB — HEMOGLOBIN A1C: Hgb A1c MFr Bld: 6.1 % (ref 4.6–6.5)

## 2015-10-25 LAB — LDL CHOLESTEROL, DIRECT: LDL DIRECT: 85 mg/dL

## 2015-10-25 MED ORDER — VALSARTAN-HYDROCHLOROTHIAZIDE 160-25 MG PO TABS: 1.0000 | ORAL_TABLET | Freq: Every day | ORAL | Status: DC

## 2015-10-25 MED ORDER — DICLOFENAC SODIUM ER 100 MG PO TB24: 100.0000 mg | ORAL_TABLET | Freq: Every day | ORAL | Status: DC

## 2015-10-25 NOTE — Progress Notes (Signed)
Subjective:  Patient ID: Alec Gibsononnie Ray Griffing Jr., male    DOB: Sep 22, 1960  Age: 55 y.o. MRN: [id number]  CC: The primary encounter diagnosis was Diabetes mellitus without complication (HCC). Diagnoses of Knee pain, chronic, right, Ankle pain, left, Prediabetes, Pain in right knee, and HYPERTENSION, BENIGN were also pertinent to this visit.  HPI Alec [employer] Yawn Jr. presents for multiple issues.  He made a Mychart appointment for joint pain but has not been seen for follow up on prediabetes  And hypertension since June 2016.    Right knee pain , crepitus,,  Taking advil 600 mg prn ast dose > 24 hrs ago.  not taking NSAIDs daily,but has persistent pain  That is aggravated by inactivity.  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 .    Ankle pain, left.  In Nov twisted left ankle.  Had Bilateral malleolar pain , stepping off of ladder  ankle inverted and he fell,  Noticed bruising on the lateral side. Swelled initially .  Treated with ice,  Brace and elevation ,  Wore brace for 2 months Has constant minimal pain but recent hiking trip on a trail which had loose graval and paths in the woods aggravated it   Outpatient Prescriptions Prior to Visit  Medication Sig Dispense Refill  . amLODipine (NORVASC) 2.5 MG tablet Take 1 tablet (2.5 mg total) by mouth daily. 90 tablet 3  . metoprolol succinate (TOPROL-XL) 50 MG 24 hr tablet TAKE ONE TABLET BY MOUTH ONCE DAILY WITH FOOD OR  IMMEDIATELY  FOLLOWING  A  MEAL 30 tablet 2  . valsartan-hydrochlorothiazide (DIOVAN-HCT) 80-12.5 MG per tablet TAKE ONE TABLET BY MOUTH ONCE DAILY 90 tablet 0  . Cholecalciferol (VITAMIN D) 2000 UNITS tablet Take 2,000 Units by mouth daily. Reported on 10/25/2015    . ranitidine (ZANTAC) 150 MG tablet Take 1 tablet (150 mg total) by mouth 2 (two) times daily. (Patient  not taking: Reported on 10/25/2015) 180 tablet   . valsartan-hydrochlorothiazide (DIOVAN-HCT) 80-12.5 MG tablet TAKE ONE TABLET BY MOUTH ONCE DAILY 90 tablet 0   No facility-administered medications prior to visit.    Review of Systems;  Patient denies headache, fevers, malaise, unintentional weight loss, skin rash, eye pain, sinus congestion and sinus pain, sore throat, dysphagia,  hemoptysis , cough, dyspnea, wheezing, chest pain, palpitations, orthopnea, edema, abdominal pain, nausea, melena, diarrhea, constipation, flank pain, dysuria, hematuria, urinary  Frequency, nocturia, numbness, tingling, seizures,  Focal weakness, Loss of consciousness,  Tremor, insomnia, depression, anxiety, and suicidal ideation.      Objective:  BP 152/80 mmHg  Pulse 72  Temp(Src) 97.8 F (36.6 C) (Oral)  Resp 12  Ht 5\' 11"  (1.803 m)  Wt 245 lb 8 oz (111.358 kg)  BMI 34.26 kg/m2  SpO2 97%  BP Readings from Last 3 Encounters:  10/25/15 152/80  03/01/15 156/82  01/09/15 150/78    Wt Readings from Last 3 Encounters:  10/25/15 245 lb 8 oz (111.358 kg)  01/09/15 248 lb (112.492 kg)  08/29/14 257 lb 1.9 oz (116.629 kg)    General appearance: alert, cooperative and appears stated age Back: symmetric, no curvature. ROM normal. No CVA tenderness. Lungs: clear to auscultation bilaterally Heart: regular rate and rhythm, S1, S2 normal, no murmur, click, rub or gallop Abdomen: soft, non-tender; bowel sounds normal; no masses,  no organomegaly Pulses:  2+ and symmetric Skin: Skin color, texture, turgor normal. No rashes or lesions Lymph nodes: Cervical, supraclavicular, and axillary nodes normal. MSK: right knee with crepitus , no effusions or focal point tenderness. Left ankle wihtout bruising or swelling.  ROM limited by pain with eversion   Lab Results  Component Value Date   HGBA1C 6.1 10/25/2015   HGBA1C 6.3 07/18/2014   HGBA1C 6.0 01/19/2014    Lab Results  Component Value Date   CREATININE  0.87 10/26/2015   CREATININE 0.82 01/09/2015   CREATININE 0.89 07/12/2014    Lab Results  Component Value Date   WBC 11.7* 07/12/2014   HGB 15.3 07/12/2014   HCT 44.7 07/12/2014   PLT 241 07/12/2014   GLUCOSE 106* 10/26/2015   CHOL 162 10/25/2015   TRIG 244.0* 10/25/2015   HDL 34.30* 10/25/2015   LDLDIRECT 85.0 10/25/2015   LDLCALC 120* 01/09/2015   ALT 28 10/26/2015   AST 30 10/26/2015   NA 138 10/26/2015   K 3.9 10/26/2015   CL 105 10/26/2015   CREATININE 0.87 10/26/2015   BUN 19 10/26/2015   CO2 19 10/26/2015   TSH 1.98 01/09/2015   PSA 0.20 07/18/2014   HGBA1C 6.1 10/25/2015   MICROALBUR <0.7 10/25/2015    Assessment & Plan:   Problem List Items Addressed This Visit    HYPERTENSION, BENIGN    elevated today, with no NSAID use in over 24 hours.  max doses of amlodipine and metoprolol,  Will increase valsartan to 160 mg daily follow up one month .  Lab Results  Component Value Date   CREATININE 0.87 10/26/2015   Lab Results  Component Value Date   NA 138 10/26/2015   K 3.9 10/26/2015   CL 105 10/26/2015   CO2 19 10/26/2015         Relevant Medications   valsartan-hydrochlorothiazide (DIOVAN HCT) 160-25 MG tablet   Prediabetes - Primary    Complicated by obesity.  His A1c is lower today  And he is folowing a mediterranean diet. He   has no proteinuria.  Foot exam normal.  Eye exam up to date.    Lab Results  Component Value Date   HGBA1C 6.1 10/25/2015    Lab Results  Component Value Date   MICROALBUR <0.7 10/25/2015             RESOLVED: Pain in right knee   Knee pain, chronic    Mild to moderrate degenerative changes by current x rays.  Referral to sports medicien requested.  Continue NSAIds, weight loss and activity modification       Relevant Orders   DG Knee Complete 4 Views Right (Completed)   Ankle pain    Recent ankle sprain suspected. Plain films done to rule out avulsion type fracture . NSAIDs and sports medicine referral         Relevant Orders   DG Ankle Complete Left (Completed)      I have discontinued Alec Ali's ranitidine. I am also having him start on valsartan-hydrochlorothiazide and Diclofenac Sodium CR. Additionally, I am having him maintain his Vitamin D, amLODipine, valsartan-hydrochlorothiazide, metoprolol succinate, and esomeprazole.  Meds ordered this encounter  Medications  . esomeprazole (NEXIUM) 20 MG capsule    Sig: Take 20 mg by mouth daily at 12 noon.  . valsartan-hydrochlorothiazide (DIOVAN HCT) 160-25 MG tablet    Sig: Take 1 tablet by mouth daily.    Dispense:  90 tablet    Refill:  0    Do  not fill for 45 days  . Diclofenac Sodium CR 100 MG 24 hr tablet    Sig: Take 1 tablet (100 mg total) by mouth daily.    Dispense:  30 tablet    Refill:  1    Medications Discontinued During This Encounter  Medication Reason  . valsartan-hydrochlorothiazide (DIOVAN-HCT) 80-12.5 MG tablet Duplicate  . ranitidine (ZANTAC) 150 MG tablet Change in therapy    Follow-up: Return in about 4 weeks (around 11/22/2015) for follow up diabetes.   Sherlene Shams, MD

## 2015-10-25 NOTE — Progress Notes (Signed)
Pre-visit discussion using our clinic review tool. No additional management support is needed unless otherwise documented below in the visit note.  

## 2015-10-25 NOTE — Patient Instructions (Addendum)
You will need to return for follow up on hypertension and diabetes in one month  I have ordered plain x rays of knee and ankle.  [facility]   Take the diclofenac once daily for 2 weeks.  Ok to combine with TYLENOL  but NOT WITH MOTRIN OR ALEVE  You can use up to 2000 mg tylenol  daily    I have increased your valsartan to 160 mg daily,  Take 2 of your 80 mg tablets dialy in te morning  Continue amlodipine and metopolol at current doses

## 2015-10-26 ENCOUNTER — Other Ambulatory Visit (INDEPENDENT_AMBULATORY_CARE_PROVIDER_SITE_OTHER): Payer: BLUE CROSS/BLUE SHIELD

## 2015-10-26 DIAGNOSIS — E119 Type 2 diabetes mellitus without complications: Secondary | ICD-10-CM

## 2015-10-26 LAB — COMPREHENSIVE METABOLIC PANEL
ALK PHOS: 57 U/L (ref 39–117)
ALT: 28 U/L (ref 0–53)
AST: 30 U/L (ref 0–37)
Albumin: 4.6 g/dL (ref 3.5–5.2)
BILIRUBIN TOTAL: 0.5 mg/dL (ref 0.2–1.2)
BUN: 19 mg/dL (ref 6–23)
CO2: 19 mEq/L (ref 19–32)
CREATININE: 0.87 mg/dL (ref 0.40–1.50)
Calcium: 9.7 mg/dL (ref 8.4–10.5)
Chloride: 105 mEq/L (ref 96–112)
GFR: 97.85 mL/min (ref >60.00)
GLUCOSE: 106 mg/dL — AB (ref 70–99)
Potassium: 3.9 mEq/L (ref 3.5–5.1)
SODIUM: 138 meq/L (ref 135–145)
Total Protein: 8.1 g/dL (ref 6.0–8.3)

## 2015-10-28 DIAGNOSIS — M1711 Unilateral primary osteoarthritis, right knee: Secondary | ICD-10-CM | POA: Insufficient documentation

## 2015-10-28 DIAGNOSIS — M25579 Pain in unspecified ankle and joints of unspecified foot: Secondary | ICD-10-CM | POA: Insufficient documentation

## 2015-10-28 DIAGNOSIS — M25561 Pain in right knee: Secondary | ICD-10-CM | POA: Insufficient documentation

## 2015-10-28 NOTE — Assessment & Plan Note (Signed)
Complicated by obesity.  His A1c is lower today  And he is folowing a mediterranean diet. He   has no proteinuria.  Foot exam normal.  Eye exam up to date.    Lab Results  Component Value Date   HGBA1C 6.1 10/25/2015    Lab Results  Component Value Date   MICROALBUR <0.7 10/25/2015

## 2015-10-28 NOTE — Assessment & Plan Note (Signed)
Recent ankle sprain suspected. Plain films done to rule out avulsion type fracture . NSAIDs and sports medicine referral

## 2015-10-28 NOTE — Assessment & Plan Note (Signed)
elevated today, with no NSAID use in over 24 hours.  max doses of amlodipine and metoprolol,  Will increase valsartan to 160 mg daily follow up one month .  Lab Results  Component Value Date   CREATININE 0.87 10/26/2015   Lab Results  Component Value Date   NA 138 10/26/2015   K 3.9 10/26/2015   CL 105 10/26/2015   CO2 19 10/26/2015

## 2015-10-28 NOTE — Assessment & Plan Note (Signed)
Mild to moderrate degenerative changes by current x rays.  Referral to sports medicien requested.  Continue NSAIds, weight loss and activity modification

## 2015-10-30 ENCOUNTER — Encounter: Payer: Self-pay | Admitting: Internal Medicine

## 2015-11-22 ENCOUNTER — Ambulatory Visit: Payer: BLUE CROSS/BLUE SHIELD | Admitting: Internal Medicine

## 2015-11-22 DIAGNOSIS — Z0289 Encounter for other administrative examinations: Secondary | ICD-10-CM

## 2015-12-03 ENCOUNTER — Other Ambulatory Visit: Payer: Self-pay | Admitting: Internal Medicine

## 2015-12-04 NOTE — Telephone Encounter (Signed)
Most recent prescription ordered. valsartan-hydrochlorothiazide (DIOVAN HCT) 160-25 MG tablet 90 tablet 0 10/25/2015    The medication being asked for refill was refilled 03/2015. Please advise

## 2015-12-06 ENCOUNTER — Other Ambulatory Visit: Payer: Self-pay

## 2015-12-06 ENCOUNTER — Telehealth: Payer: Self-pay | Admitting: Internal Medicine

## 2015-12-06 MED ORDER — VALSARTAN-HYDROCHLOROTHIAZIDE 160-25 MG PO TABS: 1.0000 | ORAL_TABLET | Freq: Every day | ORAL | Status: DC

## 2015-12-06 NOTE — Telephone Encounter (Signed)
Dose was increased at march visit to 160/25.  Needs follow up BP check refilled.

## 2015-12-06 NOTE — Telephone Encounter (Signed)
Spoke with the patient's wife, she went to refill the old prescription and forget he had a dosage change.  I have resent the new prescription and I have called [facility] to confirm receipt.

## 2015-12-06 NOTE — Telephone Encounter (Signed)
Pt's wife was calling about the medication valsartan-hydrochlorothiazide (DIOVAN HCT) 160-25 MG tablet. Is he suppose to take it 2x a day? Please call home number.

## 2015-12-06 NOTE — Telephone Encounter (Signed)
lvm stating a appointment was needing to be scheduled.

## 2015-12-07 ENCOUNTER — Other Ambulatory Visit: Payer: Self-pay | Admitting: Internal Medicine

## 2016-01-14 ENCOUNTER — Other Ambulatory Visit: Payer: Self-pay | Admitting: Internal Medicine

## 2016-04-05 ENCOUNTER — Other Ambulatory Visit: Payer: Self-pay | Admitting: Internal Medicine

## 2016-04-12 ENCOUNTER — Ambulatory Visit: Payer: BLUE CROSS/BLUE SHIELD | Admitting: Family Medicine

## 2016-04-12 ENCOUNTER — Telehealth: Payer: Self-pay | Admitting: Internal Medicine

## 2016-04-12 NOTE — Telephone Encounter (Signed)
Okay by me. No need to charge fee.

## 2016-04-12 NOTE — Telephone Encounter (Signed)
His appoontment is not with me.  It's with Dr Adriana Simascook.  Ask him if he is going to charge a no who fee.

## 2016-04-12 NOTE — Telephone Encounter (Signed)
FYI, Pt wife called stating he is feeling better and does not need to come in for the appt today. Let me know if the appt needs to be cancelled. Thank you!

## 2016-04-12 NOTE — Telephone Encounter (Signed)
Please advise 

## 2016-04-12 NOTE — Telephone Encounter (Signed)
fyi

## 2016-08-06 ENCOUNTER — Other Ambulatory Visit: Payer: Self-pay | Admitting: Internal Medicine

## 2016-08-07 NOTE — Telephone Encounter (Signed)
Pt last OV and labs were on 10/25/15 with no future scheduled appts. Last refill on 07/07/16. Ok to refill?

## 2016-09-02 ENCOUNTER — Other Ambulatory Visit: Payer: Self-pay | Admitting: Internal Medicine

## 2016-09-02 DIAGNOSIS — I10 Essential (primary) hypertension: Secondary | ICD-10-CM

## 2016-09-02 DIAGNOSIS — E782 Mixed hyperlipidemia: Secondary | ICD-10-CM

## 2016-09-03 NOTE — Telephone Encounter (Signed)
Last office visit 10/25/15, requesting 90 day supply  No office visit scheduled Please advise

## 2016-09-04 NOTE — Telephone Encounter (Signed)
VALSARTAN RWFILL IS Denied,   BECAUSE HE IS Overdue for 6 month follow up on hypertension,  Needs labs prior to refill on valsartan,  Will refill the metoprolol . FASTING LABS ORDERED ,

## 2016-09-05 ENCOUNTER — Other Ambulatory Visit: Payer: Self-pay | Admitting: Internal Medicine

## 2016-09-05 DIAGNOSIS — R7303 Prediabetes: Secondary | ICD-10-CM

## 2016-09-05 NOTE — Telephone Encounter (Signed)
I called pt- your next opening was 10/07/16. I have him scheduled for that day at 11:30.  Can we see him sooner or give him enough Valsartan/HCTZ to last him until then? He is completely out of it, but has Amlodipine and Metoprolol. Please advise.

## 2016-09-06 NOTE — Telephone Encounter (Signed)
Pt's wife has requested a call, in regards to this medication  Zella BallRobin [phone]

## 2016-09-06 NOTE — Telephone Encounter (Addendum)
Patient is scheduled for fasting labs on 09/10/16.  Explained to patient must have lab work prior refill medication.

## 2016-09-06 NOTE — Telephone Encounter (Signed)
Pt's wife called, she has concerns that pt is completely out of medication, and if it's okay to be off this medication. Wife stated that pharmacy allowed pt 3 pills.

## 2016-09-07 NOTE — Telephone Encounter (Signed)
AGAIN DENIED BECAUSE HE IS OVERDUE BY 3 MONTHS  ON LABS AND OFFICE VISIT,  NEEDS LABS PRIOR TO REFILLS

## 2016-09-10 ENCOUNTER — Ambulatory Visit (INDEPENDENT_AMBULATORY_CARE_PROVIDER_SITE_OTHER): Payer: BLUE CROSS/BLUE SHIELD

## 2016-09-10 ENCOUNTER — Other Ambulatory Visit (INDEPENDENT_AMBULATORY_CARE_PROVIDER_SITE_OTHER): Payer: BLUE CROSS/BLUE SHIELD

## 2016-09-10 DIAGNOSIS — E782 Mixed hyperlipidemia: Secondary | ICD-10-CM

## 2016-09-10 DIAGNOSIS — Z23 Encounter for immunization: Secondary | ICD-10-CM | POA: Diagnosis not present

## 2016-09-10 DIAGNOSIS — R7989 Other specified abnormal findings of blood chemistry: Secondary | ICD-10-CM

## 2016-09-10 DIAGNOSIS — R7303 Prediabetes: Secondary | ICD-10-CM

## 2016-09-10 DIAGNOSIS — I10 Essential (primary) hypertension: Secondary | ICD-10-CM | POA: Diagnosis not present

## 2016-09-10 LAB — COMPREHENSIVE METABOLIC PANEL
ALK PHOS: 46 U/L (ref 39–117)
ALT: 34 U/L (ref 0–53)
AST: 25 U/L (ref 0–37)
Albumin: 4.6 g/dL (ref 3.5–5.2)
BILIRUBIN TOTAL: 0.6 mg/dL (ref 0.2–1.2)
BUN: 12 mg/dL (ref 6–23)
CO2: 30 meq/L (ref 19–32)
Calcium: 9.7 mg/dL (ref 8.4–10.5)
Chloride: 103 mEq/L (ref 96–112)
Creatinine, Ser: 0.8 mg/dL (ref 0.40–1.50)
GFR: 107.44 mL/min (ref >60.00)
GLUCOSE: 127 mg/dL — AB (ref 70–99)
POTASSIUM: 4.2 meq/L (ref 3.5–5.1)
Sodium: 137 mEq/L (ref 135–145)
TOTAL PROTEIN: 7.8 g/dL (ref 6.0–8.3)

## 2016-09-10 LAB — LIPID PANEL
Cholesterol: 184 mg/dL (ref 0–200)
HDL: 34.8 mg/dL — AB (ref >39.00)
NonHDL: 149.04
TRIGLYCERIDES: 339 mg/dL — AB (ref 0.0–149.0)
Total CHOL/HDL Ratio: 5
VLDL: 67.8 mg/dL — AB (ref 0.0–40.0)

## 2016-09-10 LAB — LDL CHOLESTEROL, DIRECT: Direct LDL: 83 mg/dL

## 2016-09-10 LAB — HEMOGLOBIN A1C: Hgb A1c MFr Bld: 6.4 % (ref 4.6–6.5)

## 2016-09-11 MED ORDER — VALSARTAN-HYDROCHLOROTHIAZIDE 160-25 MG PO TABS: 1.0000 | ORAL_TABLET | Freq: Every day | ORAL | 0 refills | Status: DC

## 2016-09-11 NOTE — Telephone Encounter (Signed)
Pt had his labs yesterday and PCP was informed of normal kidney function. Dr. Darrick Huntsmanullo states it is ok to Rf Valsartan/HCTZ x 30 days. Rf sent. See meds. Pt must keep 10/07/16 OV. Left detailed mess informing pt.

## 2016-09-12 ENCOUNTER — Encounter: Payer: Self-pay | Admitting: Internal Medicine

## 2016-09-12 NOTE — Telephone Encounter (Signed)
Agree with above 

## 2016-10-07 ENCOUNTER — Ambulatory Visit (INDEPENDENT_AMBULATORY_CARE_PROVIDER_SITE_OTHER): Payer: BLUE CROSS/BLUE SHIELD | Admitting: Internal Medicine

## 2016-10-07 ENCOUNTER — Encounter: Payer: Self-pay | Admitting: Internal Medicine

## 2016-10-07 VITALS — BP 160/94 | HR 90 | Temp 98.3°F | Resp 16 | Wt 252.0 lb

## 2016-10-07 DIAGNOSIS — E119 Type 2 diabetes mellitus without complications: Secondary | ICD-10-CM | POA: Diagnosis not present

## 2016-10-07 DIAGNOSIS — Z0000 Encounter for general adult medical examination without abnormal findings: Secondary | ICD-10-CM

## 2016-10-07 DIAGNOSIS — I10 Essential (primary) hypertension: Secondary | ICD-10-CM | POA: Diagnosis not present

## 2016-10-07 DIAGNOSIS — E1169 Type 2 diabetes mellitus with other specified complication: Secondary | ICD-10-CM

## 2016-10-07 DIAGNOSIS — Z125 Encounter for screening for malignant neoplasm of prostate: Secondary | ICD-10-CM | POA: Diagnosis not present

## 2016-10-07 DIAGNOSIS — IMO0001 Reserved for inherently not codable concepts without codable children: Secondary | ICD-10-CM

## 2016-10-07 DIAGNOSIS — E785 Hyperlipidemia, unspecified: Secondary | ICD-10-CM

## 2016-10-07 LAB — MICROALBUMIN / CREATININE URINE RATIO
Creatinine,U: 50 mg/dL
MICROALB/CREAT RATIO: 1.4 mg/g (ref 0.0–30.0)

## 2016-10-07 MED ORDER — METOPROLOL SUCCINATE ER 50 MG PO TB24: 50.0000 mg | ORAL_TABLET | Freq: Every day | ORAL | 2 refills | Status: DC

## 2016-10-07 MED ORDER — VALSARTAN-HYDROCHLOROTHIAZIDE 160-25 MG PO TABS: 1.0000 | ORAL_TABLET | Freq: Every day | ORAL | 2 refills | Status: DC

## 2016-10-07 MED ORDER — AMLODIPINE BESYLATE 5 MG PO TABS: 2.5000 mg | ORAL_TABLET | Freq: Every day | ORAL | 1 refills | Status: DC

## 2016-10-07 NOTE — Patient Instructions (Addendum)
your blood pressure is consistently above the currently recommended acceptable standards of 120/70, so I am recommending that you increase the amlodipine to 5 mg daily and continue your metoprolol and valsartan   Wait  1-2 weeks for a repeat bp check.  You can send me the readings through Mychart.    Glucosamine/chondroiton sulfate and fish oil ,may ahelp joint pain if used daily .   You can also use tylenol up to 2000 mg daily if needed for joint pain

## 2016-10-07 NOTE — Progress Notes (Signed)
Pre visit review using our clinic review tool, if applicable. No additional management support is needed unless otherwise documented below in the visit note. 

## 2016-10-07 NOTE — Progress Notes (Signed)
Patient ID: Alec Ali., male    DOB: [birth year]  Age: 56 y.o. MRN: [id number]  The patient is here for annual  examination and management of  Diet controlled type 2 DM,  Hyperlipidemia, and hypertriglyceridemia.  Last seen one year ago.   He feels generally well .He has gained weight and is not checking his blood sugars.   Lab Results  Component Value Date   HGBA1C 6.4 09/10/2016   Lab Results  Component Value Date   CHOL 184 09/10/2016   HDL 34.80 (L) 09/10/2016   LDLCALC 120 (H) 01/09/2015   LDLDIRECT 83.0 09/10/2016   TRIG 339.0 (H) 09/10/2016   CHOLHDL 5 09/10/2016   Lab Results  Component Value Date   MICROALBUR <0.7 10/07/2016  The risk factors are reflected in the social history.  The roster of all physicians providing medical care to patient - is listed in the Snapshot section of the chart.  Activities of daily living:  The patient is 100% independent in all ADLs: dressing, toileting, feeding as well as independent mobility  Home safety : The patient has smoke detectors in the home. They wear seatbelts.  There are no firearms at home. There is no violence in the home.   There is no risks for hepatitis, STDs or HIV. There is no   history of blood transfusion. They have no travel history to infectious disease endemic areas of the world.  The patient has seen their dentist in the last six month. They have seen their eye doctor in the last year.    They do not  have excessive sun exposure. Discussed the need for sun protection: hats, long sleeves and use of sunscreen if there is significant sun exposure.   Diet: the importance of a healthy diet is discussed. They do have a healthy diet.  The benefits of regular aerobic exercise were discussed. He does not exercse regularly .  Depression screen: there are no signs or vegative symptoms of depression- irritability, change in appetite, anhedonia, sadness/tearfullness.  The following portions of the patient's history  were reviewed and updated as appropriate: allergies, current medications, past family history, past medical history,  past surgical history, past social history  and problem list.  Visual acuity was not assessed per patient preference since she has regular follow up with her ophthalmologist. Hearing and body mass index were assessed and reviewed.   During the course of the visit the patient was educated and counseled about appropriate screening and preventive services including : fall prevention , diabetes screening, nutrition counseling, colorectal cancer screening, and recommended immunizations.    CC: The primary encounter diagnosis was Type 2 diabetes mellitus without complication, without long-term current use of insulin (HCC). Diagnoses of HYPERTENSION, BENIGN, Hyperlipidemia associated with type 2 diabetes mellitus (HCC), Visit for preventive health examination, Normal cardiac stress test, and Morbid obesity (HCC) were also pertinent to this visit.   Sinus congestion and fullness, rhinitis is clear,  Eyes watering.   Right shoulder  bilatera knee pain r > L with  crepitus   History Alec has a past medical history of GERD (gastroesophageal reflux disease) and Hypertension.   He has a past surgical history that includes Tonsillectomy and Inguinal hernia repair (1987).   His family history includes Breast cancer in his mother; COPD in his father; Colon cancer (age of onset: 75) in his maternal grandmother; Diabetes in his mother; Hypertension in his father; Thyroid disease in his mother.He reports that he has quit smoking. He  has quit using smokeless tobacco. His smokeless tobacco use included Snuff. He reports that he drinks alcohol. He reports that he does not use drugs.  Outpatient Medications Prior to Visit  Medication Sig Dispense Refill  . Cholecalciferol (VITAMIN D) 2000 UNITS tablet Take 2,000 Units by mouth daily. Reported on 10/25/2015    . esomeprazole (NEXIUM) 20 MG capsule Take  20 mg by mouth daily at 12 noon.    Marland Kitchen amLODipine (NORVASC) 2.5 MG tablet TAKE ONE TABLET BY MOUTH ONCE DAILY 90 tablet 3  . Diclofenac Sodium CR 100 MG 24 hr tablet Take 1 tablet (100 mg total) by mouth daily. 30 tablet 1  . metoprolol succinate (TOPROL-XL) 50 MG 24 hr tablet TAKE ONE TABLET BY MOUTH ONCE DAILY WITH FOOD OR  IMMEDIATELY FOLLOWING A MEAL 30 tablet 0  . valsartan-hydrochlorothiazide (DIOVAN HCT) 160-25 MG tablet Take 1 tablet by mouth daily. 30 tablet 0   No facility-administered medications prior to visit.     Review of Systems  Patient denies headache, fevers, malaise, unintentional weight loss, skin rash, eye pain, sinus congestion and sinus pain, sore throat, dysphagia,  hemoptysis , cough, dyspnea, wheezing, chest pain, palpitations, orthopnea, edema, abdominal pain, nausea, melena, diarrhea, constipation, flank pain, dysuria, hematuria, urinary  Frequency, nocturia, numbness, tingling, seizures,  Focal weakness, Loss of consciousness,  Tremor, insomnia, depression, anxiety, and suicidal ideation.     Objective:  BP (!) 160/94   Pulse 90   Temp 98.3 F (36.8 C) (Oral)   Resp 16   Wt 252 lb (114.3 kg)   SpO2 97%   BMI 35.15 kg/m   Physical Exam   General appearance: alert, cooperative and appears stated age Ears: normal TM's and external ear canals both ears Throat: lips, mucosa, and tongue normal; teeth and gums normal Neck: no adenopathy, no carotid bruit, supple, symmetrical, trachea midline and thyroid not enlarged, symmetric, no tenderness/mass/nodules Back: symmetric, no curvature. ROM normal. No CVA tenderness. Lungs: clear to auscultation bilaterally Heart: regular rate and rhythm, S1, S2 normal, no murmur, click, rub or gallop Abdomen: soft, non-tender; bowel sounds normal; no masses,  no organomegaly Pulses: 2+ and symmetric Skin: Skin color, texture, turgor normal. No rashes or lesions Lymph nodes: Cervical, supraclavicular, and axillary nodes  normal.    Assessment & Plan:   Problem List Items Addressed This Visit    DM (diabetes mellitus), type 2 (HCC) - Primary    Complicated by obesity.  His A1c is higher  today  And he is folowing a mediterranean diet. He   has no proteinuria.  Foot exam normal.  Eye exam up to date.    Lab Results  Component Value Date   HGBA1C 6.4 09/10/2016    Lab Results  Component Value Date   MICROALBUR <0.7 10/07/2016             Relevant Medications   valsartan-hydrochlorothiazide (DIOVAN HCT) 160-25 MG tablet   Hyperlipidemia associated with type 2 diabetes mellitus (HCC)    Triglycerides are elevated today to over 300.  Recommended a low glycemic index diet, weight  loss and regular exercise.  Advised t o  repeat in 6 months.   Lab Results  Component Value Date   CHOL 223* 08/31/2014   HDL 52.20 08/31/2014   LDLDIRECT 109.0 08/31/2014   TRIG 343.0* 08/31/2014   CHOLHDL 4 08/31/2014   Lab Results  Component Value Date   ALT 20 08/31/2014   AST 24 08/31/2014   ALKPHOS 73 08/31/2014  BILITOT 0.4 08/31/2014       Lab Results  Component Value Date   CHOL 184 09/10/2016   HDL 34.80 (L) 09/10/2016   LDLCALC 120 (H) 01/09/2015   LDLDIRECT 83.0 09/10/2016   TRIG 339.0 (H) 09/10/2016   CHOLHDL 5 09/10/2016         Relevant Medications   metoprolol succinate (TOPROL-XL) 50 MG 24 hr tablet   valsartan-hydrochlorothiazide (DIOVAN HCT) 160-25 MG tablet   amLODipine (NORVASC) 5 MG tablet   HYPERTENSION, BENIGN    Uncontrolled on current regimen, increase amlodipine to 5 mg daily. Continue metoprolol and arb. Lab Results  Component Value Date   CREATININE 0.80 09/10/2016   Lab Results  Component Value Date   NA 137 09/10/2016   K 4.2 09/10/2016   CL 103 09/10/2016   CO2 30 09/10/2016          Relevant Medications   metoprolol succinate (TOPROL-XL) 50 MG 24 hr tablet   valsartan-hydrochlorothiazide (DIOVAN HCT) 160-25 MG tablet   amLODipine (NORVASC) 5 MG  tablet   Other Relevant Orders   Microalbumin / creatinine urine ratio (Completed)   Morbid obesity (HCC)    BMI is 35,  Accompanied by type 2 DM,  Hypertension and hyperlipidemia. I have addressed  BMI and recommended wt loss of 10% of body weigh over the next 6 months using a low glycemic index diet and regular exercise a minimum of 5 days per week.        Normal cardiac stress test    Done in 2012 for evaluation of chest pain       Visit for preventive health examination    Annual comprehensive preventive exam was done as well as an evaluation and management of acute and chronic conditions .  During the course of the visit the patient was educated and counseled about appropriate screening and preventive services including :  diabetes screening, lipid analysis with projected  10 year  risk for CAD , nutrition counseling, prostate and colorectal cancer screening, and recommended immunizations.  Printed recommendations for health maintenance screenings was given.   Lab Results  Component Value Date   PSA 0.20 07/18/2014            I have discontinued Alec Ali Diclofenac Sodium CR. I have also changed his metoprolol succinate and amLODipine. Additionally, I am having him maintain his Vitamin D, esomeprazole, and valsartan-hydrochlorothiazide.  Meds ordered this encounter  Medications  . metoprolol succinate (TOPROL-XL) 50 MG 24 hr tablet    Sig: Take 1 tablet (50 mg total) by mouth daily. Take with or immediately following a meal.    Dispense:  90 tablet    Refill:  2    Please consider 90 day supplies to promote better adherence  . valsartan-hydrochlorothiazide (DIOVAN HCT) 160-25 MG tablet    Sig: Take 1 tablet by mouth daily.    Dispense:  90 tablet    Refill:  2  . amLODipine (NORVASC) 5 MG tablet    Sig: Take 0.5 tablets (2.5 mg total) by mouth daily.    Dispense:  90 tablet    Refill:  1    Medications Discontinued During This Encounter  Medication Reason  .  metoprolol succinate (TOPROL-XL) 50 MG 24 hr tablet Reorder  . valsartan-hydrochlorothiazide (DIOVAN HCT) 160-25 MG tablet Reorder  . amLODipine (NORVASC) 2.5 MG tablet Reorder  . Diclofenac Sodium CR 100 MG 24 hr tablet Allergic reaction    Follow-up: Return in about 6 months (around  04/09/2017), or fasting labs piror .   Sherlene ShamsULLO, Devarius Nelles L, MD

## 2016-10-08 NOTE — Assessment & Plan Note (Signed)
Triglycerides are elevated today to over 300.  Recommended a low glycemic index diet, weight  loss and regular exercise.  Advised t o  repeat in 6 months.   Lab Results  Component Value Date   CHOL 223* 08/31/2014   HDL 52.20 08/31/2014   LDLDIRECT 109.0 08/31/2014   TRIG 343.0* 08/31/2014   CHOLHDL 4 08/31/2014   Lab Results  Component Value Date   ALT 20 08/31/2014   AST 24 08/31/2014   ALKPHOS 73 08/31/2014   BILITOT 0.4 08/31/2014       Lab Results  Component Value Date   CHOL 184 09/10/2016   HDL 34.80 (L) 09/10/2016   LDLCALC 120 (H) 01/09/2015   LDLDIRECT 83.0 09/10/2016   TRIG 339.0 (H) 09/10/2016   CHOLHDL 5 09/10/2016

## 2016-10-08 NOTE — Assessment & Plan Note (Signed)
Done in 2012 for evaluation of chest pain

## 2016-10-08 NOTE — Assessment & Plan Note (Signed)
Uncontrolled on current regimen, increase amlodipine to 5 mg daily. Continue metoprolol and arb. Lab Results  Component Value Date   CREATININE 0.80 09/10/2016   Lab Results  Component Value Date   NA 137 09/10/2016   K 4.2 09/10/2016   CL 103 09/10/2016   CO2 30 09/10/2016

## 2016-10-08 NOTE — Assessment & Plan Note (Signed)
Complicated by obesity.  His A1c is higher  today  And he is folowing a mediterranean diet. He   has no proteinuria.  Foot exam normal.  Eye exam up to date.    Lab Results  Component Value Date   HGBA1C 6.4 09/10/2016    Lab Results  Component Value Date   MICROALBUR <0.7 10/07/2016

## 2016-10-08 NOTE — Assessment & Plan Note (Signed)
BMI is 35,  Accompanied by type 2 DM,  Hypertension and hyperlipidemia. I have addressed  BMI and recommended wt loss of 10% of body weigh over the next 6 months using a low glycemic index diet and regular exercise a minimum of 5 days per week.

## 2016-10-08 NOTE — Assessment & Plan Note (Signed)
Annual comprehensive preventive exam was done as well as an evaluation and management of acute and chronic conditions .  During the course of the visit the patient was educated and counseled about appropriate screening and preventive services including :  diabetes screening, lipid analysis with projected  10 year  risk for CAD , nutrition counseling, prostate and colorectal cancer screening, and recommended immunizations.  Printed recommendations for health maintenance screenings was given.   Lab Results  Component Value Date   PSA 0.20 07/18/2014

## 2016-10-09 ENCOUNTER — Encounter: Payer: Self-pay | Admitting: Internal Medicine

## 2016-10-09 ENCOUNTER — Other Ambulatory Visit: Payer: Self-pay | Admitting: Internal Medicine

## 2016-10-09 DIAGNOSIS — E119 Type 2 diabetes mellitus without complications: Secondary | ICD-10-CM

## 2016-10-09 DIAGNOSIS — E785 Hyperlipidemia, unspecified: Principal | ICD-10-CM

## 2016-10-09 DIAGNOSIS — Z125 Encounter for screening for malignant neoplasm of prostate: Secondary | ICD-10-CM

## 2016-10-09 DIAGNOSIS — E1169 Type 2 diabetes mellitus with other specified complication: Secondary | ICD-10-CM

## 2016-11-06 ENCOUNTER — Encounter: Payer: Self-pay | Admitting: Internal Medicine

## 2016-11-06 DIAGNOSIS — I10 Essential (primary) hypertension: Secondary | ICD-10-CM

## 2016-11-06 MED ORDER — AMLODIPINE BESYLATE 5 MG PO TABS: 5.0000 mg | ORAL_TABLET | Freq: Every day | ORAL | 1 refills | Status: DC

## 2016-11-26 ENCOUNTER — Other Ambulatory Visit: Payer: Self-pay

## 2016-11-26 DIAGNOSIS — I10 Essential (primary) hypertension: Secondary | ICD-10-CM

## 2016-11-26 MED ORDER — AMLODIPINE BESYLATE 5 MG PO TABS: 5.0000 mg | ORAL_TABLET | Freq: Every day | ORAL | 1 refills | Status: DC

## 2017-02-19 ENCOUNTER — Encounter: Payer: Self-pay | Admitting: Internal Medicine

## 2017-02-20 ENCOUNTER — Other Ambulatory Visit: Payer: Self-pay | Admitting: Internal Medicine

## 2017-02-20 MED ORDER — ENALAPRIL-HYDROCHLOROTHIAZIDE 10-25 MG PO TABS: 1.0000 | ORAL_TABLET | Freq: Every day | ORAL | 0 refills | Status: DC

## 2017-04-11 ENCOUNTER — Other Ambulatory Visit (INDEPENDENT_AMBULATORY_CARE_PROVIDER_SITE_OTHER): Payer: BLUE CROSS/BLUE SHIELD

## 2017-04-11 DIAGNOSIS — E1169 Type 2 diabetes mellitus with other specified complication: Secondary | ICD-10-CM

## 2017-04-11 DIAGNOSIS — E119 Type 2 diabetes mellitus without complications: Secondary | ICD-10-CM

## 2017-04-11 DIAGNOSIS — Z125 Encounter for screening for malignant neoplasm of prostate: Secondary | ICD-10-CM

## 2017-04-11 DIAGNOSIS — E785 Hyperlipidemia, unspecified: Secondary | ICD-10-CM | POA: Diagnosis not present

## 2017-04-11 LAB — HEMOGLOBIN A1C: Hgb A1c MFr Bld: 5.9 % (ref 4.6–6.5)

## 2017-04-11 LAB — COMPREHENSIVE METABOLIC PANEL
ALT: 24 U/L (ref 0–53)
AST: 22 U/L (ref 0–37)
Albumin: 4.7 g/dL (ref 3.5–5.2)
Alkaline Phosphatase: 50 U/L (ref 39–117)
BUN: 15 mg/dL (ref 6–23)
CALCIUM: 9.8 mg/dL (ref 8.4–10.5)
CO2: 30 meq/L (ref 19–32)
CREATININE: 0.84 mg/dL (ref 0.40–1.50)
Chloride: 100 mEq/L (ref 96–112)
GFR: 101.33 mL/min (ref >60.00)
Glucose, Bld: 113 mg/dL — ABNORMAL HIGH (ref 70–99)
Potassium: 4.3 mEq/L (ref 3.5–5.1)
SODIUM: 136 meq/L (ref 135–145)
Total Bilirubin: 0.8 mg/dL (ref 0.2–1.2)
Total Protein: 7.6 g/dL (ref 6.0–8.3)

## 2017-04-11 LAB — PSA: PSA: 0.23 ng/mL (ref 0.10–4.00)

## 2017-04-11 LAB — LIPID PANEL
CHOL/HDL RATIO: 5
Cholesterol: 174 mg/dL (ref 0–200)
HDL: 34 mg/dL — ABNORMAL LOW (ref >39.00)
NONHDL: 139.85
TRIGLYCERIDES: 304 mg/dL — AB (ref 0.0–149.0)
VLDL: 60.8 mg/dL — ABNORMAL HIGH (ref 0.0–40.0)

## 2017-04-11 LAB — LDL CHOLESTEROL, DIRECT: Direct LDL: 81 mg/dL

## 2017-04-13 ENCOUNTER — Encounter: Payer: Self-pay | Admitting: Internal Medicine

## 2017-04-14 ENCOUNTER — Ambulatory Visit (INDEPENDENT_AMBULATORY_CARE_PROVIDER_SITE_OTHER): Payer: BLUE CROSS/BLUE SHIELD | Admitting: Internal Medicine

## 2017-04-14 ENCOUNTER — Encounter: Payer: Self-pay | Admitting: Internal Medicine

## 2017-04-14 VITALS — BP 154/90 | HR 63 | Temp 98.4°F | Ht 71.0 in | Wt 240.6 lb

## 2017-04-14 DIAGNOSIS — E119 Type 2 diabetes mellitus without complications: Secondary | ICD-10-CM | POA: Diagnosis not present

## 2017-04-14 DIAGNOSIS — Z23 Encounter for immunization: Secondary | ICD-10-CM | POA: Diagnosis not present

## 2017-04-14 DIAGNOSIS — M1711 Unilateral primary osteoarthritis, right knee: Secondary | ICD-10-CM

## 2017-04-14 DIAGNOSIS — E669 Obesity, unspecified: Secondary | ICD-10-CM

## 2017-04-14 DIAGNOSIS — I10 Essential (primary) hypertension: Secondary | ICD-10-CM | POA: Diagnosis not present

## 2017-04-14 DIAGNOSIS — E559 Vitamin D deficiency, unspecified: Secondary | ICD-10-CM | POA: Diagnosis not present

## 2017-04-14 DIAGNOSIS — E1169 Type 2 diabetes mellitus with other specified complication: Secondary | ICD-10-CM

## 2017-04-14 DIAGNOSIS — E785 Hyperlipidemia, unspecified: Secondary | ICD-10-CM | POA: Diagnosis not present

## 2017-04-14 MED ORDER — FUROSEMIDE 20 MG PO TABS: 20.0000 mg | ORAL_TABLET | Freq: Every day | ORAL | 3 refills | Status: DC

## 2017-04-14 MED ORDER — LOSARTAN POTASSIUM 50 MG PO TABS: 50.0000 mg | ORAL_TABLET | Freq: Every day | ORAL | 3 refills | Status: DC

## 2017-04-14 NOTE — Patient Instructions (Addendum)
Your blood pressure is ABOVE GOAL.  The goal is  Is < 130/80.    I am making the following changes.   Stop the enalaparil hct  Start losartan 50 mg daily  Continue amlodipine 5 mg daily and metoprolol 50 mg daily   I have given your an rx for a fluid pill ito take as needed (furosemide )     There are plenty of high protein low carb cookies,  But they're not called "cookies."  Look for them in the diet section  where the protein shakes are  Sold.   All of these have 5 g sugar or less : Power crunch Atkins bars KIND :thE  "low glycemic index"  variety QUEST : (taste better after being microwaved OUT OF THE WRAPPER)

## 2017-04-14 NOTE — Progress Notes (Signed)
Subjective:  Patient ID: Alec Gibsononnie Ray Esterline Jr., male    DOB: [birth year]  Age: 56 y.o. MRN: [id number]  CC: The primary encounter diagnosis was Vitamin D deficiency. Diagnoses of Need for immunization against influenza, Primary osteoarthritis of right knee, Obesity (BMI 30.0-34.9), HYPERTENSION, BENIGN, Hyperlipidemia associated with type 2 diabetes mellitus (HCC), and Type 2 diabetes mellitus without complication, without long-term current use of insulin (HCC) were also pertinent to this visit.iet controlled diabetes mellitus 4    HPI Alec Ray News Corporationlley Jr. presents for  6 month follow up on hypertension,  Hyperlipidemia and  Type 2 Dm, diet controlled..  Patient does not check blood sugars more than once a month,  Last one was 120 a month ago in a fasting state.  DoEs not recall any above 160 or less than 80.  No complaints today.  Taking his medications as directed,  exercising on a regular basis and  trying to lose weight. Notices occasional cramps in legs at night,  Not during exercise.  Patient voices awareness  of the foods he/she needs to avoid,  And follows a low GI diet about 80 % of the time.  Has not had an annual diabetic eye exam.  Denies numbness and tingling in lower extremities.  Denies hypoglycemic symptoms.  Driving to [place] next month for a hunting trip,  Uses a naturopathic commercial supplement as an alternative to acetazolamide to avoid altitude sickness   Outpatient Medications Prior to Visit  Medication Sig Dispense Refill  . amLODipine (NORVASC) 5 MG tablet Take 1 tablet (5 mg total) by mouth daily. 90 tablet 1  . Cholecalciferol (VITAMIN D) 2000 UNITS tablet Take 2,000 Units by mouth daily. Reported on 10/25/2015    . esomeprazole (NEXIUM) 20 MG capsule Take 20 mg by mouth daily at 12 noon.    . metoprolol succinate (TOPROL-XL) 50 MG 24 hr tablet Take 1 tablet (50 mg total) by mouth daily. Take with or immediately following a meal. 90 tablet 2  .  enalapril-hydrochlorothiazide (VASERETIC) 10-25 MG tablet Take 1 tablet by mouth daily. 90 tablet 0   No facility-administered medications prior to visit.     Review of Systems;  Patient denies headache, fevers, malaise, unintentional weight loss, skin rash, eye pain, sinus congestion and sinus pain, sore throat, dysphagia,  hemoptysis , cough, dyspnea, wheezing, chest pain, palpitations, orthopnea, edema, abdominal pain, nausea, melena, diarrhea, constipation, flank pain, dysuria, hematuria, urinary  Frequency, nocturia, numbness, tingling, seizures,  Focal weakness, Loss of consciousness,  Tremor, insomnia, depression, anxiety, and suicidal ideation.      Objective:  BP (!) 154/90   Pulse 63   Temp 98.4 F (36.9 C) (Oral)   Ht 5\' 11"  (1.803 m)   Wt 240 lb 9.6 oz (109.1 kg)   SpO2 96%   BMI 33.56 kg/m   BP Readings from Last 3 Encounters:  04/14/17 (!) 154/90  10/07/16 (!) 160/94  10/25/15 (!) 152/80    Wt Readings from Last 3 Encounters:  04/14/17 240 lb 9.6 oz (109.1 kg)  10/07/16 252 lb (114.3 kg)  10/25/15 245 lb 8 oz (111.4 kg)    General appearance: alert, cooperative and appears stated age Ears: normal TM's and external ear canals both ears Throat: lips, mucosa, and tongue normal; teeth and gums normal Neck: no adenopathy, no carotid bruit, supple, symmetrical, trachea midline and thyroid not enlarged, symmetric, no tenderness/mass/nodules Back: symmetric, no curvature. ROM normal. No CVA tenderness. Lungs: clear to auscultation bilaterally Heart: regular rate and  rhythm, S1, S2 normal, no murmur, click, rub or gallop Abdomen: soft, non-tender; bowel sounds normal; no masses,  no organomegaly Pulses: 2+ and symmetric Skin: Skin color, texture, turgor normal. No rashes or lesions Lymph nodes: Cervical, supraclavicular, and axillary nodes normal.  Lab Results  Component Value Date   HGBA1C 5.9 04/11/2017   HGBA1C 6.4 09/10/2016   HGBA1C 6.1 10/25/2015    Lab  Results  Component Value Date   CREATININE 0.84 04/11/2017   CREATININE 0.80 09/10/2016   CREATININE 0.87 10/26/2015    Lab Results  Component Value Date   WBC 11.7 (H) 07/12/2014   HGB 15.3 07/12/2014   HCT 44.7 07/12/2014   PLT 241 07/12/2014   GLUCOSE 113 (H) 04/11/2017   CHOL 174 04/11/2017   TRIG 304.0 (H) 04/11/2017   HDL 34.00 (L) 04/11/2017   LDLDIRECT 81.0 04/11/2017   LDLCALC 120 (H) 01/09/2015   ALT 24 04/11/2017   AST 22 04/11/2017   NA 136 04/11/2017   K 4.3 04/11/2017   CL 100 04/11/2017   CREATININE 0.84 04/11/2017   BUN 15 04/11/2017   CO2 30 04/11/2017   TSH 1.98 01/09/2015   PSA 0.23 04/11/2017   HGBA1C 5.9 04/11/2017   MICROALBUR <0.7 10/07/2016      Assessment & Plan:   Problem List Items Addressed This Visit    DM (diabetes mellitus), type 2 (HCC)    Secondary to obesity.  Both improved With exercise , low GI / mediterranean diet. He   has no proteinuria.  Foot exam normal.  Eye exam is overdue and reminder given  Lab Results  Component Value Date   HGBA1C 5.9 04/11/2017    Lab Results  Component Value Date   MICROALBUR <0.7 10/07/2016             Relevant Medications   losartan (COZAAR) 50 MG tablet   Other Relevant Orders   Hemoglobin A1c   Comprehensive metabolic panel   Microalbumin / creatinine urine ratio   Hyperlipidemia associated with type 2 diabetes mellitus (HCC)    Triglycerides have improved somewhat and direct LDL is < 100.  Continue  low glycemic index diet, weight  loss and regular exercise.  Advised t o  repeat in 6 months.   Lab Results  Component Value Date   CHOL 174 04/11/2017   HDL 34.00 (L) 04/11/2017   LDLCALC 120 (H) 01/09/2015   LDLDIRECT 81.0 04/11/2017   TRIG 304.0 (H) 04/11/2017   CHOLHDL 5 04/11/2017         Relevant Medications   losartan (COZAAR) 50 MG tablet   furosemide (LASIX) 20 MG tablet   Other Relevant Orders   Lipid panel   HYPERTENSION, BENIGN    Not at goal  on current  regimen of enalapril/hct, amlodipine and metoprolol .  Normal cr, lytes.  stopping enalapril,  Starting losartan 50 mg daily,  Continue amlodipine and metoprolol at current doses.  Lab Results  Component Value Date   CREATININE 0.84 04/11/2017   Lab Results  Component Value Date   NA 136 04/11/2017   K 4.3 04/11/2017   CL 100 04/11/2017   CO2 30 04/11/2017         Relevant Medications   losartan (COZAAR) 50 MG tablet   furosemide (LASIX) 20 MG tablet   Obesity (BMI 30.0-34.9)    I have congratulated hi in reduction of   BMI and encouraged  Continued weight loss with goal of 10% of body weight over the  next 6 months using a low glycemic index diet and regular exercise a minimum of 5 days per week.        Right knee DJD    Other Visit Diagnoses    Vitamin D deficiency    -  Primary   Relevant Orders   VITAMIN D 25 Hydroxy (Vit-D Deficiency, Fractures)   Need for immunization against influenza       Relevant Orders   Flu Vaccine QUAD 36+ mos IM (Completed)      I have discontinued Alec Ali's enalapril-hydrochlorothiazide. I am also having him start on losartan and furosemide. Additionally, I am having him maintain his Vitamin D, esomeprazole, metoprolol succinate, and amLODipine.  Meds ordered this encounter  Medications  . losartan (COZAAR) 50 MG tablet    Sig: Take 1 tablet (50 mg total) by mouth daily.    Dispense:  90 tablet    Refill:  3  . furosemide (LASIX) 20 MG tablet    Sig: Take 1 tablet (20 mg total) by mouth daily. As needed for fluid retention    Dispense:  30 tablet    Refill:  3    Medications Discontinued During This Encounter  Medication Reason  . enalapril-hydrochlorothiazide (VASERETIC) 10-25 MG tablet     Follow-up: Return in about 6 months (around 10/12/2017).   Sherlene Shams, MD

## 2017-04-15 NOTE — Assessment & Plan Note (Addendum)
Triglycerides have improved somewhat and direct LDL is < 100.  Continue  low glycemic index diet, weight  loss and regular exercise.  Advised t o  repeat in 6 months.   Lab Results  Component Value Date   CHOL 174 04/11/2017   HDL 34.00 (L) 04/11/2017   LDLCALC 120 (H) 01/09/2015   LDLDIRECT 81.0 04/11/2017   TRIG 304.0 (H) 04/11/2017   CHOLHDL 5 04/11/2017

## 2017-04-15 NOTE — Assessment & Plan Note (Signed)
I have congratulated hi in reduction of   BMI and encouraged  Continued weight loss with goal of 10% of body weight over the next 6 months using a low glycemic index diet and regular exercise a minimum of 5 days per week.

## 2017-04-15 NOTE — Assessment & Plan Note (Addendum)
Secondary to obesity.  Both improved With exercise , low GI / mediterranean diet. He   has no proteinuria.  Foot exam normal.  Eye exam is overdue and reminder given  Lab Results  Component Value Date   HGBA1C 5.9 04/11/2017    Lab Results  Component Value Date   MICROALBUR <0.7 10/07/2016

## 2017-04-15 NOTE — Assessment & Plan Note (Addendum)
Not at goal  on current regimen of enalapril/hct, amlodipine and metoprolol .  Normal cr, lytes.  stopping enalapril,  Starting losartan 50 mg daily,  Continue amlodipine and metoprolol at current doses.  Lab Results  Component Value Date   CREATININE 0.84 04/11/2017   Lab Results  Component Value Date   NA 136 04/11/2017   K 4.3 04/11/2017   CL 100 04/11/2017   CO2 30 04/11/2017

## 2017-04-30 NOTE — Telephone Encounter (Signed)
Error

## 2017-05-27 ENCOUNTER — Encounter: Payer: Self-pay | Admitting: Internal Medicine

## 2017-05-27 MED ORDER — LOSARTAN POTASSIUM 100 MG PO TABS: 100.0000 mg | ORAL_TABLET | Freq: Every day | ORAL | 0 refills | Status: DC

## 2017-05-28 ENCOUNTER — Other Ambulatory Visit: Payer: Self-pay | Admitting: Internal Medicine

## 2017-05-28 DIAGNOSIS — I10 Essential (primary) hypertension: Secondary | ICD-10-CM

## 2017-07-08 ENCOUNTER — Other Ambulatory Visit: Payer: Self-pay | Admitting: Internal Medicine

## 2017-07-08 DIAGNOSIS — I10 Essential (primary) hypertension: Secondary | ICD-10-CM

## 2017-09-18 ENCOUNTER — Other Ambulatory Visit: Payer: Self-pay | Admitting: Internal Medicine

## 2017-10-13 ENCOUNTER — Ambulatory Visit: Payer: BLUE CROSS/BLUE SHIELD | Admitting: Internal Medicine

## 2017-12-18 ENCOUNTER — Other Ambulatory Visit: Payer: Self-pay | Admitting: *Deleted

## 2017-12-18 ENCOUNTER — Other Ambulatory Visit: Payer: Self-pay | Admitting: Internal Medicine

## 2017-12-18 DIAGNOSIS — I10 Essential (primary) hypertension: Secondary | ICD-10-CM

## 2017-12-18 MED ORDER — AMLODIPINE BESYLATE 5 MG PO TABS: 5.0000 mg | ORAL_TABLET | Freq: Every day | ORAL | 0 refills | Status: DC

## 2017-12-18 NOTE — Telephone Encounter (Signed)
Copied from CRM [id number]. Topic: Quick Communication - Rx Refill/Question >> Dec 18, 2017 10:59 AM Louie Bun, Rosey Bath D wrote: Medication: amLODipine (NORVASC) 5 MG tablet Has the patient contacted their pharmacy? Yes (Agent: If no, request that the patient contact the pharmacy for the refill.) Preferred Pharmacy (with phone number or street name): [address] [place], [place] [address] Agent: Please be advised that RX refills may take up to 3 business days. We ask that you follow-up with your pharmacy.

## 2017-12-18 NOTE — Telephone Encounter (Signed)
Attempted to call patient to scheduled follow up appointment- left message to call back to schedule- Rx refill request sent to office for review- patient is requesting 90 day supply sent to [facility] [place], [place]  LOV: 04/14/17  PCP: Darrick Huntsman  [facility]: [facility]

## 2018-08-11 ENCOUNTER — Telehealth: Payer: Self-pay

## 2018-08-11 DIAGNOSIS — I10 Essential (primary) hypertension: Secondary | ICD-10-CM

## 2018-08-11 NOTE — Telephone Encounter (Signed)
Received a refill request from [facility] in [place] requesting a refill for chlorthalidone 25mg  take 1 tablet by mouth once daily. This medication is not in the pt current medication list nor is it in his historical medication list.   Last OV: 04/14/2017 Next OV: not scheduled  Fax states that medication was last filled on 03/27/2018.

## 2018-08-11 NOTE — Telephone Encounter (Signed)
Denied,   I can no longer see "historical meds " thanks to Epic upgrade and since he has not been seen since Sept 2018 I cannot refill without a nurse visit at least to document vital sings and he'll also need  A CMET to be done on day of visit.

## 2018-08-11 NOTE — Addendum Note (Signed)
Addended by: Sherlene Shams on: 08/11/2018 12:01 PM   Modules accepted: Orders

## 2018-08-12 NOTE — Telephone Encounter (Signed)
LMTCB. Pec may speak with pt.  

## 2018-08-13 NOTE — Telephone Encounter (Signed)
Patient moved to [place] and states the pharmacy requested refill from the wrong PCP.

## 2020-06-12 NOTE — Telephone Encounter (Signed)
Yes I will see him

## 2020-06-12 NOTE — Telephone Encounter (Signed)
Pt's wife called and states that they moved back from [place] and wants pt to be seen by Dr Darrick Huntsman for med management. He has not been seen since 2018. Please advise

## 2020-06-12 NOTE — Telephone Encounter (Signed)
Will you schedule for a new patient appt.  

## 2020-07-05 ENCOUNTER — Telehealth: Payer: Self-pay | Admitting: Internal Medicine

## 2020-07-05 ENCOUNTER — Encounter: Payer: Self-pay | Admitting: Internal Medicine

## 2020-07-05 ENCOUNTER — Ambulatory Visit: Payer: BLUE CROSS/BLUE SHIELD | Admitting: Internal Medicine

## 2020-07-05 ENCOUNTER — Other Ambulatory Visit: Payer: Self-pay

## 2020-07-05 VITALS — BP 178/82 | HR 70 | Temp 97.9°F | Ht 70.5 in | Wt 241.6 lb

## 2020-07-05 DIAGNOSIS — E785 Hyperlipidemia, unspecified: Secondary | ICD-10-CM | POA: Diagnosis not present

## 2020-07-05 DIAGNOSIS — Z23 Encounter for immunization: Secondary | ICD-10-CM | POA: Diagnosis not present

## 2020-07-05 DIAGNOSIS — M1711 Unilateral primary osteoarthritis, right knee: Secondary | ICD-10-CM

## 2020-07-05 DIAGNOSIS — E119 Type 2 diabetes mellitus without complications: Secondary | ICD-10-CM | POA: Diagnosis not present

## 2020-07-05 DIAGNOSIS — M791 Myalgia, unspecified site: Secondary | ICD-10-CM

## 2020-07-05 DIAGNOSIS — I10 Essential (primary) hypertension: Secondary | ICD-10-CM | POA: Diagnosis not present

## 2020-07-05 DIAGNOSIS — T466X5A Adverse effect of antihyperlipidemic and antiarteriosclerotic drugs, initial encounter: Secondary | ICD-10-CM

## 2020-07-05 DIAGNOSIS — Z125 Encounter for screening for malignant neoplasm of prostate: Secondary | ICD-10-CM | POA: Diagnosis not present

## 2020-07-05 DIAGNOSIS — E669 Obesity, unspecified: Secondary | ICD-10-CM

## 2020-07-05 DIAGNOSIS — E1169 Type 2 diabetes mellitus with other specified complication: Secondary | ICD-10-CM

## 2020-07-05 LAB — COMPREHENSIVE METABOLIC PANEL
ALT: 31 U/L (ref 0–53)
AST: 26 U/L (ref 0–37)
Albumin: 4.6 g/dL (ref 3.5–5.2)
Alkaline Phosphatase: 52 U/L (ref 39–117)
BUN: 16 mg/dL (ref 6–23)
CO2: 30 mEq/L (ref 19–32)
Calcium: 9.7 mg/dL (ref 8.4–10.5)
Chloride: 99 mEq/L (ref 96–112)
Creatinine, Ser: 0.88 mg/dL (ref 0.40–1.50)
GFR: 95.85 mL/min (ref >60.00)
Glucose, Bld: 118 mg/dL — ABNORMAL HIGH (ref 70–99)
Potassium: 3.7 mEq/L (ref 3.5–5.1)
Sodium: 138 mEq/L (ref 135–145)
Total Bilirubin: 1.2 mg/dL (ref 0.2–1.2)
Total Protein: 7.5 g/dL (ref 6.0–8.3)

## 2020-07-05 LAB — MICROALBUMIN / CREATININE URINE RATIO
Creatinine,U: 25.1 mg/dL
Microalb Creat Ratio: 2.8 mg/g (ref 0.0–30.0)
Microalb, Ur: <0.7 mg/dL (ref 0.0–1.9)

## 2020-07-05 LAB — LIPID PANEL
Cholesterol: 172 mg/dL (ref 0–200)
HDL: 36.9 mg/dL — ABNORMAL LOW (ref >39.00)
NonHDL: 135.14
Total CHOL/HDL Ratio: 5
Triglycerides: 256 mg/dL — ABNORMAL HIGH (ref 0.0–149.0)
VLDL: 51.2 mg/dL — ABNORMAL HIGH (ref 0.0–40.0)

## 2020-07-05 LAB — HEMOGLOBIN A1C: Hgb A1c MFr Bld: 6.2 % (ref 4.6–6.5)

## 2020-07-05 LAB — LDL CHOLESTEROL, DIRECT: Direct LDL: 86 mg/dL

## 2020-07-05 LAB — PSA: PSA: 0.22 ng/mL (ref 0.10–4.00)

## 2020-07-05 MED ORDER — DICLOFENAC SODIUM 1 % EX GEL: 2.0000 g | Freq: Four times a day (QID) | CUTANEOUS | 5 refills | Status: DC

## 2020-07-05 MED ORDER — TRAMADOL HCL 50 MG PO TABS: 50.0000 mg | ORAL_TABLET | Freq: Four times a day (QID) | ORAL | 0 refills | Status: AC | PRN

## 2020-07-05 MED ORDER — EZETIMIBE 10 MG PO TABS
10.0000 mg | ORAL_TABLET | Freq: Every day | ORAL | 1 refills | Status: DC
Start: 2020-07-05 — End: 2020-12-25

## 2020-07-05 NOTE — Patient Instructions (Addendum)
Your Motrin Alec Ali may be raising your blood pressure,  AND you may have "white coat hypertension,"  For your pain:  Start using 1000 mg tylenol twice daily  Add Tramadol  50 mg every 4 to 6 hours as needed  (rx sent to [facility])   No advil motrin or aleve for one week  measure your BP once daily during this  this period.  After one week ,  Resume Advil and continue to check BP readings    Return for RN visit when you have collected  2 weeks of home  readings and bring BP machine

## 2020-07-05 NOTE — Telephone Encounter (Signed)
Patient has been out of his ezetimibe (ZETIA) 10 MG tablet for a week and needs a refill.

## 2020-07-05 NOTE — Assessment & Plan Note (Addendum)
Negative Renal artery doppler for RAS per patient,  Done during his short residence in [place]. Home readings are 130/80 or less.  He has been asked to calibrate his home machine with ours,  Suspend all NSAIDs for one week ,  And recheck readings.

## 2020-07-05 NOTE — Telephone Encounter (Signed)
[facility] pharmacy needs to know it patient is taking traMADol (ULTRAM) 50 MG tablet for acute or chronic issues. Please call pharmacy to let them know.

## 2020-07-05 NOTE — Progress Notes (Signed)
Subjective:  Patient ID: Alec Ali., male    DOB: [birth year]  Age: 59 y.o. MRN: [id number]  CC: The primary encounter diagnosis was Hyperlipidemia associated with type 2 diabetes mellitus (HCC). Diagnoses of Type 2 diabetes mellitus without complication, without long-term current use of insulin (HCC), Prostate cancer screening, White coat syndrome with diagnosis of hypertension, Need for immunization against influenza, Primary osteoarthritis of right knee, Obesity (BMI 30.0-34.9), and Myalgia due to statin were also pertinent to this visit.  HPI Alec Ali. presents for REESTABLISHMENT OF CARE   This visit occurred during the SARS-CoV-2 public health emergency.  Safety protocols were in place, including screening questions prior to the visit, additional usage of staff PPE, and extensive cleaning of exam room while observing appropriate contact time as indicated for disinfecting solutions.   Patient has received NO  doses of the available COVID 19 vaccines.   Patient continues to mask when outside of the home except when walking in yard or at safe distances from others .  Patient denies any change in mood or development of unhealthy behaviors resuting from the pandemic's restriction of activities and socialization.    2019 moved to [place] with wife Zella Ball  2020 relocated to [place] , [place]  built a home  . Then mother in law  Had a hip replacement and had to return to 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   1) HYPERTENSION :  CHRONIC,  HISTORICALLY UNCONTROLLED.    Currently taking metoprol xl 100 mg .  Chlorthalidone ad losartan .  Readings were < 130/80 in 2019 per home log.  Prior workup for secondary causes included a renal artery doppler that was reportedly negative for RS in [place],  But no prior sleep study .  Doesn't sleep well due to moderate to severe chronic right shoulder pain  preventing sleeping on shoulder.  Wakes him up every time he turns over in bed  Takes 2-3 advil every morning .  Measures BP at home and readings have not been done in a few months but last readings after increasing metoprolol to 100 mg   2) chronic right shoulder pain :  No prior evaluation or steroid injections.  Discussed referral to [facility] .  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      History Alec Ali has a past medical history of GERD (gastroesophageal reflux disease) and Hypertension.   He has a past surgical history that includes Tonsillectomy and Inguinal hernia repair (1987).   His family history includes Breast cancer in his mother; COPD in his father; Colon cancer (age of onset: 57) in his maternal grandmother; Diabetes in his mother; Hypertension in his father; Thyroid disease in his mother.He reports that he has quit smoking. He has quit using smokeless tobacco.  His smokeless tobacco use included snuff. He reports current alcohol use. He reports that he does not use drugs.  Outpatient Medications Prior to Visit  Medication Sig Dispense Refill  . chlorthalidone (HYGROTON) 25 MG tablet Take 25 mg by mouth daily.    Marland Kitchen losartan (COZAAR) 100 MG tablet TAKE 1 TABLET BY MOUTH ONCE DAILY DOSE INCREASE USE 50 MG TABLETS FIRST; (Patient taking differently: Take 100 mg by mouth every evening. ) 90 tablet 1  . metoprolol succinate (TOPROL-XL) 100 MG 24 hr tablet Take 100 mg by mouth daily.    . Multiple Vitamins-Minerals (MENS 50+  MULTI VITAMIN/MIN) TABS Take 1 tablet by mouth in the morning and at bedtime.    . pantoprazole (PROTONIX) 40 MG tablet Take 40 mg by mouth daily.    Marland Kitchen ezetimibe (ZETIA) 10 MG tablet Take 10 mg by mouth daily.    Marland Kitchen amLODipine (NORVASC) 5 MG tablet Take 1 tablet (5 mg total) by mouth daily. (Patient not taking: Reported on 07/05/2020) 90 tablet 0  . Cholecalciferol (VITAMIN D) 2000 UNITS tablet Take 2,000 Units by mouth daily. Reported on 10/25/2015  (Patient not taking: Reported on 07/05/2020)    . esomeprazole (NEXIUM) 20 MG capsule Take 20 mg by mouth daily at 12 noon. (Patient not taking: Reported on 07/05/2020)    . furosemide (LASIX) 20 MG tablet Take 1 tablet (20 mg total) by mouth daily. As needed for fluid retention (Patient not taking: Reported on 07/05/2020) 30 tablet 3  . metoprolol succinate (TOPROL-XL) 50 MG 24 hr tablet TAKE 1 TABLET BY MOUTH ONCE DAILY/... TAKE WITH OR IMMEDIATELY FOLLOWING A MEAL (Patient not taking: Reported on 07/05/2020) 90 tablet 2   No facility-administered medications prior to visit.    Review of Systems:  Patient denies headache, fevers, malaise, unintentional weight loss, skin rash, eye pain, sinus congestion and sinus pain, sore throat, dysphagia,  hemoptysis , cough, dyspnea, wheezing, chest pain, palpitations, orthopnea, edema, abdominal pain, nausea, melena, diarrhea, constipation, flank pain, dysuria, hematuria, urinary  Frequency, nocturia, numbness, tingling, seizures,  Focal weakness, Loss of consciousness,  Tremor, insomnia, depression, anxiety, and suicidal ideation.     Objective:  BP (!) 178/82   Pulse 70   Temp 97.9 F (36.6 C)   Ht 5' 10.5" (1.791 m)   Wt 241 lb 9.6 oz (109.6 kg)   SpO2 98%   BMI 34.18 kg/m   Physical Exam:  General appearance: alert, cooperative and appears stated age Ears: normal TM's and external ear canals both ears Throat: lips, mucosa, and tongue normal; teeth and gums normal Neck: no adenopathy, no carotid bruit, supple, symmetrical, trachea midline and thyroid not enlarged, symmetric, no tenderness/mass/nodules Back: symmetric, no curvature. ROM normal. No CVA tenderness. Lungs: clear to auscultation bilaterally Heart: regular rate and rhythm, S1, S2 normal, no murmur, click, rub or gallop Abdomen: soft, non-tender; bowel sounds normal; no masses,  no organomegaly Pulses: 2+ and symmetric Skin: Skin color, texture, turgor normal. No rashes or  lesions Lymph nodes: Cervical, supraclavicular, and axillary nodes normal.  Assessment & Plan:   Problem List Items Addressed This Visit      Unprioritized   White coat syndrome with diagnosis of hypertension    Negative Renal artery doppler for RAS per patient,  Done during his short residence in [place]. Home readings are 130/80 or less.  He has been asked to calibrate his home machine with ours,  Suspend all NSAIDs for one week ,  And recheck readings.       Relevant Medications   chlorthalidone (HYGROTON) 25 MG tablet   metoprolol succinate (TOPROL-XL) 100 MG 24 hr tablet   Right knee DJD    Prescribing tylenol and tramadol for use during temporary suspension of NSAIDs to determine if BP elevation is NSAID related       Relevant Medications   traMADol (ULTRAM) 50 MG tablet   diclofenac Sodium (VOLTAREN) 1 % GEL   Prostate cancer screening    Annual screening has been resumed and PSA is low.   Lab Results  Component Value Date   PSA 0.22 07/05/2020  PSA 0.23 04/11/2017   PSA 0.20 07/18/2014          Relevant Orders   PSA (Completed)   Obesity (BMI 30.0-34.9)    He has a muscular build,  Which is over represented in BMI,  But  I have addressed  BMI and recommended a low glycemic index diet and that he start exercising with a goal of 30 minutes of aerobic exercise a minimum of 5 days per week.       Myalgia due to statin    Previous trial of statin resulted in diffuse muscle pain.        Hyperlipidemia associated with type 2 diabetes mellitus (HCC) - Primary    Triglycerides are < 300 and  direct LDL is < 100.  Continue Zetia ,   low glycemic index diet, weight  loss and regular exercise.  Advised t o  repeat in 6 months.   Lab Results  Component Value Date   CHOL 172 07/05/2020   HDL 36.90 (L) 07/05/2020   LDLCALC 120 (H) 01/09/2015   LDLDIRECT 86.0 07/05/2020   TRIG 256.0 (H) 07/05/2020   CHOLHDL 5 07/05/2020         Relevant Orders   Lipid panel  (Completed)   DM (diabetes mellitus), type 2 (HCC)    Has been diet controlled for years.  No indication for anti hyperglycemics,  But weight loss ,  Continued use of Zetia (statin intolerant) and ARB advised   Lab Results  Component Value Date   HGBA1C 6.2 07/05/2020   Lab Results  Component Value Date   MICROALBUR <0.7 07/05/2020   MICROALBUR <0.7 10/07/2016            Relevant Orders   Comprehensive metabolic panel (Completed)   Microalbumin / creatinine urine ratio (Completed)   Hemoglobin A1c (Completed)    Other Visit Diagnoses    Need for immunization against influenza       Relevant Orders   Flu Vaccine QUAD 36+ mos IM (Completed)     I provided  45 minutes of  face-to-face time during this encounter reviewing patient's current problems and past surgeries, labs and imaging studies, providing counseling on the above mentioned problems , and coordination  of care .  I have discontinued Alec R. Conran Jr.'s Vitamin D, esomeprazole, furosemide, and amLODipine. I am also having him start on traMADol and diclofenac Sodium. Additionally, I am having him maintain his losartan, chlorthalidone, pantoprazole, metoprolol succinate, and Mens 50+ Multi Vitamin/Min.  Meds ordered this encounter  Medications  . traMADol (ULTRAM) 50 MG tablet    Sig: Take 1 tablet (50 mg total) by mouth every 6 (six) hours as needed for up to 7 days.    Dispense:  28 tablet    Refill:  0  . diclofenac Sodium (VOLTAREN) 1 % GEL    Sig: Apply 2 g topically 4 (four) times daily.    Dispense:  100 g    Refill:  5    Medications Discontinued During This Encounter  Medication Reason  . amLODipine (NORVASC) 5 MG tablet Change in therapy  . Cholecalciferol (VITAMIN D) 2000 UNITS tablet Change in therapy  . esomeprazole (NEXIUM) 20 MG capsule Change in therapy  . furosemide (LASIX) 20 MG tablet Discontinued by provider  . metoprolol succinate (TOPROL-XL) 50 MG 24 hr tablet Dose change     Follow-up: Return in about 3 months (around 10/03/2020) for follow up diabetes.   Sherlene Shams, MD

## 2020-07-06 DIAGNOSIS — Z125 Encounter for screening for malignant neoplasm of prostate: Secondary | ICD-10-CM | POA: Insufficient documentation

## 2020-07-06 DIAGNOSIS — T466X5A Adverse effect of antihyperlipidemic and antiarteriosclerotic drugs, initial encounter: Secondary | ICD-10-CM | POA: Insufficient documentation

## 2020-07-06 NOTE — Assessment & Plan Note (Signed)
Previous trial of statin resulted in diffuse muscle pain.

## 2020-07-06 NOTE — Assessment & Plan Note (Addendum)
Has been diet controlled for years.  No indication for anti hyperglycemics,  But weight loss ,  Continued use of Zetia (statin intolerant) and ARB advised   Lab Results  Component Value Date   HGBA1C 6.2 07/05/2020   Lab Results  Component Value Date   MICROALBUR <0.7 07/05/2020   MICROALBUR <0.7 10/07/2016

## 2020-07-06 NOTE — Assessment & Plan Note (Signed)
Annual screening has been resumed and PSA is low.   Lab Results  Component Value Date   PSA 0.22 07/05/2020   PSA 0.23 04/11/2017   PSA 0.20 07/18/2014

## 2020-07-06 NOTE — Assessment & Plan Note (Signed)
He has a muscular build,  Which is over represented in BMI,  But  I have addressed  BMI and recommended a low glycemic index diet and that he start exercising with a goal of 30 minutes of aerobic exercise a minimum of 5 days per week.

## 2020-07-06 NOTE — Progress Notes (Signed)
Your diabetes remains under excellent control   on diet alone And your LDL is at goal on Zetia.  Your prostate ca screening PSA is also normal. Please continue your current medications. Let me know when your last eye exam was done (annually needed) and by whom with a dilated retina exam to monitor for diabetic retinopathy.  I have no record of colon CA screening so we can discuss that at your follow up visit .   Regards,   Duncan Dull, MD

## 2020-07-06 NOTE — Assessment & Plan Note (Signed)
Triglycerides are < 300 and  direct LDL is < 100.  Continue Zetia ,   low glycemic index diet, weight  loss and regular exercise.  Advised t o  repeat in 6 months.   Lab Results  Component Value Date   CHOL 172 07/05/2020   HDL 36.90 (L) 07/05/2020   LDLCALC 120 (H) 01/09/2015   LDLDIRECT 86.0 07/05/2020   TRIG 256.0 (H) 07/05/2020   CHOLHDL 5 07/05/2020    

## 2020-07-06 NOTE — Telephone Encounter (Signed)
All of his medication are historical is it okay to refill them?

## 2020-07-06 NOTE — Assessment & Plan Note (Signed)
Prescribing tylenol and tramadol for use during temporary suspension of NSAIDs to determine if BP elevation is NSAID related

## 2020-07-10 MED ORDER — LOSARTAN POTASSIUM 100 MG PO TABS
100.0000 mg | ORAL_TABLET | Freq: Every evening | ORAL | 1 refills | Status: DC
Start: 2020-07-10 — End: 2020-08-24

## 2020-07-10 MED ORDER — CHLORTHALIDONE 25 MG PO TABS
25.0000 mg | ORAL_TABLET | Freq: Every day | ORAL | 1 refills | Status: DC
Start: 2020-07-10 — End: 2021-03-12

## 2020-07-10 MED ORDER — METOPROLOL SUCCINATE ER 100 MG PO TB24
100.0000 mg | ORAL_TABLET | Freq: Every day | ORAL | 1 refills | Status: DC
Start: 2020-07-10 — End: 2020-12-25

## 2020-07-10 MED ORDER — PANTOPRAZOLE SODIUM 40 MG PO TBEC
40.0000 mg | DELAYED_RELEASE_TABLET | Freq: Every day | ORAL | 1 refills | Status: DC
Start: 2020-07-10 — End: 2021-01-08

## 2020-07-26 ENCOUNTER — Ambulatory Visit: Payer: 59

## 2020-08-24 ENCOUNTER — Telehealth: Payer: Self-pay | Admitting: Internal Medicine

## 2020-08-24 MED ORDER — LOSARTAN POTASSIUM 50 MG PO TABS
100.0000 mg | ORAL_TABLET | Freq: Every evening | ORAL | 0 refills | Status: DC
Start: 2020-08-24 — End: 2021-02-02

## 2020-08-24 NOTE — Telephone Encounter (Signed)
Is this okay?

## 2020-08-24 NOTE — Telephone Encounter (Signed)
Yes,  I will send rx for  50 mg  LOSARTAN And change directions to say 2 daily

## 2020-08-24 NOTE — Telephone Encounter (Signed)
LMTCB

## 2020-08-24 NOTE — Telephone Encounter (Signed)
Patient called in stated that [facility] in [place] does not havelosartan (COZAAR) 100 MG tablet they have been out for about 6 week now but they wanted to know if he could take two of the 50 mg pills until the 100mg  comes in

## 2020-08-28 NOTE — Telephone Encounter (Signed)
LMTCB

## 2020-10-05 ENCOUNTER — Ambulatory Visit: Payer: 59 | Admitting: Internal Medicine

## 2020-11-02 ENCOUNTER — Encounter: Payer: Self-pay | Admitting: Internal Medicine

## 2020-11-02 ENCOUNTER — Ambulatory Visit (INDEPENDENT_AMBULATORY_CARE_PROVIDER_SITE_OTHER): Payer: 59 | Admitting: Internal Medicine

## 2020-11-02 ENCOUNTER — Other Ambulatory Visit: Payer: Self-pay

## 2020-11-02 VITALS — BP 190/72 | HR 81 | Temp 98.1°F | Resp 15 | Ht 70.5 in | Wt 243.4 lb

## 2020-11-02 DIAGNOSIS — E785 Hyperlipidemia, unspecified: Secondary | ICD-10-CM

## 2020-11-02 DIAGNOSIS — E1169 Type 2 diabetes mellitus with other specified complication: Secondary | ICD-10-CM

## 2020-11-02 DIAGNOSIS — E119 Type 2 diabetes mellitus without complications: Secondary | ICD-10-CM

## 2020-11-02 DIAGNOSIS — I10 Essential (primary) hypertension: Secondary | ICD-10-CM | POA: Diagnosis not present

## 2020-11-02 LAB — COMPREHENSIVE METABOLIC PANEL
ALT: 35 U/L (ref 0–53)
AST: 28 U/L (ref 0–37)
Albumin: 4.7 g/dL (ref 3.5–5.2)
Alkaline Phosphatase: 48 U/L (ref 39–117)
BUN: 17 mg/dL (ref 6–23)
CO2: 31 mEq/L (ref 19–32)
Calcium: 9.9 mg/dL (ref 8.4–10.5)
Chloride: 99 mEq/L (ref 96–112)
Creatinine, Ser: 0.85 mg/dL (ref 0.40–1.50)
GFR: 96.64 mL/min (ref >60.00)
Glucose, Bld: 129 mg/dL — ABNORMAL HIGH (ref 70–99)
Potassium: 3.8 mEq/L (ref 3.5–5.1)
Sodium: 138 mEq/L (ref 135–145)
Total Bilirubin: 0.9 mg/dL (ref 0.2–1.2)
Total Protein: 7.5 g/dL (ref 6.0–8.3)

## 2020-11-02 LAB — HEMOGLOBIN A1C: Hgb A1c MFr Bld: 6.1 % (ref 4.6–6.5)

## 2020-11-02 NOTE — Progress Notes (Signed)
Subjective:  Patient ID: Alec Ali., male    DOB: 1961/07/09  Age: 60 y.o. MRN: [id number]  CC: The primary encounter diagnosis was Type 2 diabetes mellitus without complication, without long-term current use of insulin (HCC). Diagnoses of Hyperlipidemia associated with type 2 diabetes mellitus (HCC) and White coat syndrome with diagnosis of hypertension were also pertinent to this visit.  HPI Jerimie [employer]. presents for follow up on type 2 DM and hypertension  This visit occurred during the SARS-CoV-2 public health emergency.  Safety protocols were in place, including screening questions prior to the visit, additional usage of staff PPE, and extensive cleaning of exam room while observing appropriate contact time as indicated for disinfecting solutions.   Hypertension:  Patient is taking his medications as prescribed (chlorthalidone,  Losartan and metoprolol) and notes no adverse effects.  Home BP readings have been done about once per week and are  generally  Ranging from 120 to 160,  Most in the 140 /70 range  .  He  Does not add salt to his food, but eats out daily and  avoids fried food.   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 He saw no improvement after suspending NSAIDS for 2 weeks and has had to resume use of Advil for back pain     2ndary causes of hypertension explored :  He states that he does not snore per wife.  No prior sleep study.  Type 2 DM:  He  feels generally well,  But is not  exercising regularly or trying to lose weight. Does not check blood sugars daily, usually only if she feels she may be having a hypoglycemic event. .  BS have been under 130 fasting and < 150 post prandially.  Denies any recent hypoglyemic events.  Taking   medications as directed. Following a carbohydrate modified diet 6 days per week. Denies numbness, burning and tingling of extremities. Appetite is good.    Outpatient Medications Prior to Visit  Medication Sig  Dispense Refill  . chlorthalidone (HYGROTON) 25 MG tablet Take 1 tablet (25 mg total) by mouth daily. 90 tablet 1  . ezetimibe (ZETIA) 10 MG tablet Take 1 tablet (10 mg total) by mouth daily. 90 tablet 1  . losartan (COZAAR) 50 MG tablet Take 2 tablets (100 mg total) by mouth every evening. 180 tablet 0  . metoprolol succinate (TOPROL-XL) 100 MG 24 hr tablet Take 1 tablet (100 mg total) by mouth daily. 90 tablet 1  . Multiple Vitamins-Minerals (MENS 50+ MULTI VITAMIN/MIN) TABS Take 1 tablet by mouth in the morning and at bedtime.    . pantoprazole (PROTONIX) 40 MG tablet Take 1 tablet (40 mg total) by mouth daily. 90 tablet 1  . diclofenac Sodium (VOLTAREN) 1 % GEL Apply 2 g topically 4 (four) times daily. (Patient not taking: Reported on 11/02/2020) 100 g 5   No facility-administered medications prior to visit.    Review of Systems;  Patient denies headache, fevers, malaise, unintentional weight loss, skin rash, eye pain, sinus congestion and sinus pain, sore throat, dysphagia,  hemoptysis , cough, dyspnea, wheezing, chest pain, palpitations, orthopnea, edema, abdominal pain, nausea, melena, diarrhea, constipation, flank pain, dysuria, hematuria, urinary  Frequency, nocturia, numbness, tingling, seizures,  Focal weakness, Loss of consciousness,  Tremor, insomnia, depression, anxiety, and suicidal ideation.      Objective:  BP (!) 190/72 (BP Location: Left Arm, Patient Position: Sitting, Cuff Size: Large)   Pulse 81  Temp 98.1 F (36.7 C) (Oral)   Resp 15   Ht 5' 10.5" (1.791 m)   Wt 243 lb 6.4 oz (110.4 kg)   SpO2 96%   BMI 34.43 kg/m   BP Readings from Last 3 Encounters:  11/02/20 (!) 190/72  07/05/20 (!) 178/82  04/14/17 (!) 154/90    Wt Readings from Last 3 Encounters:  11/02/20 243 lb 6.4 oz (110.4 kg)  07/05/20 241 lb 9.6 oz (109.6 kg)  04/14/17 240 lb 9.6 oz (109.1 kg)    General appearance: alert, cooperative and appears stated age Ears: normal TM's and external ear  canals both ears Throat: lips, mucosa, and tongue normal; teeth and gums normal Neck: no adenopathy, no carotid bruit, supple, symmetrical, trachea midline and thyroid not enlarged, symmetric, no tenderness/mass/nodules Back: symmetric, no curvature. ROM normal. No CVA tenderness. Lungs: clear to auscultation bilaterally Heart: regular rate and rhythm, S1, S2 normal, no murmur, click, rub or gallop Abdomen: soft, non-tender; bowel sounds normal; no masses,  no organomegaly Pulses: 2+ and symmetric Skin: Skin color, texture, turgor normal. No rashes or lesions Lymph nodes: Cervical, supraclavicular, and axillary nodes normal.  Lab Results  Component Value Date   HGBA1C 6.1 11/02/2020   HGBA1C 6.2 07/05/2020   HGBA1C 5.9 04/11/2017    Lab Results  Component Value Date   CREATININE 0.85 11/02/2020   CREATININE 0.88 07/05/2020   CREATININE 0.84 04/11/2017    Lab Results  Component Value Date   WBC 11.7 (H) 07/12/2014   HGB 15.3 07/12/2014   HCT 44.7 07/12/2014   PLT 241 07/12/2014   GLUCOSE 129 (H) 11/02/2020   CHOL 172 07/05/2020   TRIG 256.0 (H) 07/05/2020   HDL 36.90 (L) 07/05/2020   LDLDIRECT 86.0 07/05/2020   LDLCALC 120 (H) 01/09/2015   ALT 35 11/02/2020   AST 28 11/02/2020   NA 138 11/02/2020   K 3.8 11/02/2020   CL 99 11/02/2020   CREATININE 0.85 11/02/2020   BUN 17 11/02/2020   CO2 31 11/02/2020   TSH 1.98 01/09/2015   PSA 0.22 07/05/2020   HGBA1C 6.1 11/02/2020   MICROALBUR <0.7 07/05/2020    DG Ankle Complete Left  Result Date: 10/25/2015 CLINICAL DATA:  Left ankle sprain in November 2016; persistent pain bilaterally. EXAM: LEFT ANKLE COMPLETE - 3+ VIEW COMPARISON:  Left foot series of March 01, 2015 FINDINGS: The ankle joint mortise is preserved. The talar dome is intact. There is no acute or healing fracture. No all fracture is observed. The talus and calcaneus are normal in contour. There are plantar and Achilles region calcaneal spurs. The other hindfoot  bones appear normal. There is no significant soft tissue swelling. IMPRESSION: There is no acute or significant chronic bony abnormality of the left ankle. Given the persistent symptoms, MRI would be a useful next imaging step. Electronically Signed   By: David  [place] M.D.   On: 10/25/2015 11:21   DG Knee Complete 4 Views Right  Result Date: 10/25/2015 CLINICAL DATA:  Chronic right knee pain, history of previous arthrocentes and cortisone injections. EXAM: RIGHT KNEE - COMPLETE 4+ VIEW COMPARISON:  None in PACs FINDINGS: The bones of the right knee are adequately mineralized. There is mild narrowing of the medial joint compartment. There is beaking of the tibial spines. There is no acute or old fracture. There are spurs arising from the superior and lateral articular margins of the patella. No joint effusion is observed. IMPRESSION: There are mild chronic changes involving the medial and  patellofemoral compartments. There is no acute bony abnormality. Electronically Signed   By: David  [place] M.D.   On: 10/25/2015 11:22    Assessment & Plan:   Problem List Items Addressed This Visit      Unprioritized   Hyperlipidemia associated with type 2 diabetes mellitus (HCC)    Triglycerides are < 300 and  direct LDL is < 100.  Continue Zetia ,   low glycemic index diet, weight  loss and regular exercise.  Advised t o  repeat in 6 months.   Lab Results  Component Value Date   CHOL 172 07/05/2020   HDL 36.90 (L) 07/05/2020   LDLCALC 120 (H) 01/09/2015   LDLDIRECT 86.0 07/05/2020   TRIG 256.0 (H) 07/05/2020   CHOLHDL 5 07/05/2020         DM (diabetes mellitus), type 2 (HCC) - Primary    Has been diet controlled for years.  No indication for anti hyperglycemics,  But weight loss ,  Continued use of Zetia (statin intolerant) and ARB advised   Lab Results  Component Value Date   HGBA1C 6.1 11/02/2020   Lab Results  Component Value Date   MICROALBUR <0.7 07/05/2020   MICROALBUR <0.7 10/07/2016             Relevant Orders   Comprehensive metabolic panel (Completed)   Hemoglobin A1c (Completed)   White coat syndrome with diagnosis of hypertension    Negative Renal artery doppler for RAS per patient done by previous PCP in [place] (records requested again) ,  Done during his short residence in [place]. Home readings are 130/80 or less.  He has been asked to calibrate his home machine with ours.  No changes today          I have discontinued Antwian R. Giel Jr.'s diclofenac Sodium. I am also having him maintain his Mens 50+ Multi Vitamin/Min, ezetimibe, chlorthalidone, metoprolol succinate, pantoprazole, and losartan.  No orders of the defined types were placed in this encounter.   Medications Discontinued During This Encounter  Medication Reason  . diclofenac Sodium (VOLTAREN) 1 % GEL     Follow-up: Return in about 6 months (around 05/04/2021) for follow up diabetes.   Sherlene Shams, MD

## 2020-11-02 NOTE — Patient Instructions (Signed)
White coat hypertension vs food related rises?  (or both)  https://www.mata.com/.pdf">  DASH Eating Plan DASH stands for Dietary Approaches to Stop Hypertension. The DASH eating plan is a healthy eating plan that has been shown to:  Reduce high blood pressure (hypertension).  Reduce your risk for type 2 diabetes, heart disease, and stroke.  Help with weight loss. What are tips for following this plan? Reading food labels  Check food labels for the amount of salt (sodium) per serving. Choose foods with less than 5 percent of the Daily Value of sodium. Generally, foods with less than 300 milligrams (mg) of sodium per serving fit into this eating plan.  To find whole grains, look for the word "whole" as the first word in the ingredient list. Shopping  Buy products labeled as "low-sodium" or "no salt added."  Buy fresh foods. Avoid canned foods and pre-made or frozen meals. Cooking  Avoid adding salt when cooking. Use salt-free seasonings or herbs instead of table salt or sea salt. Check with your health care provider or pharmacist before using salt substitutes.  Do not fry foods. Cook foods using healthy methods such as baking, boiling, grilling, roasting, and broiling instead.  Cook with heart-healthy oils, such as olive, canola, avocado, soybean, or sunflower oil. Meal planning  Eat a balanced diet that includes: ? 4 or more servings of fruits and 4 or more servings of vegetables each day. Try to fill one-half of your plate with fruits and vegetables. ? 6-8 servings of whole grains each day. ? Less than 6 oz (170 g) of lean meat, poultry, or fish each day. A 3-oz (85-g) serving of meat is about the same size as a deck of cards. One egg equals 1 oz (28 g). ? 2-3 servings of low-fat dairy each day. One serving is 1 cup (237 mL). ? 1 serving of nuts, seeds, or beans 5 times each week. ? 2-3 servings of heart-healthy fats. Healthy fats called  omega-3 fatty acids are found in foods such as walnuts, flaxseeds, fortified milks, and eggs. These fats are also found in cold-water fish, such as sardines, salmon, and mackerel.  Limit how much you eat of: ? Canned or prepackaged foods. ? Food that is high in trans fat, such as some fried foods. ? Food that is high in saturated fat, such as fatty meat. ? Desserts and other sweets, sugary drinks, and other foods with added sugar. ? Full-fat dairy products.  Do not salt foods before eating.  Do not eat more than 4 egg yolks a week.  Try to eat at least 2 vegetarian meals a week.  Eat more home-cooked food and less restaurant, buffet, and fast food.   Lifestyle  When eating at a restaurant, ask that your food be prepared with less salt or no salt, if possible.  If you drink alcohol: ? Limit how much you use to:  0-1 drink a day for women who are not pregnant.  0-2 drinks a day for men. ? Be aware of how much alcohol is in your drink. In the [place] one drink equals one 12 oz bottle of beer (355 mL), one 5 oz glass of wine (148 mL), or one 1 oz glass of hard liquor (44 mL). General information  Avoid eating more than 2,300 mg of salt a day. If you have hypertension, you may need to reduce your sodium intake to 1,500 mg a day.  Work with your health care provider to maintain a healthy body weight  or to lose weight. Ask what an ideal weight is for you.  Get at least 30 minutes of exercise that causes your heart to beat faster (aerobic exercise) most days of the week. Activities may include walking, swimming, or biking.  Work with your health care provider or dietitian to adjust your eating plan to your individual calorie needs. What foods should I eat? Fruits All fresh, dried, or frozen fruit. Canned fruit in natural juice (without added sugar). Vegetables Fresh or frozen vegetables (raw, steamed, roasted, or grilled). Low-sodium or reduced-sodium tomato and vegetable juice.  Low-sodium or reduced-sodium tomato sauce and tomato paste. Low-sodium or reduced-sodium canned vegetables. Grains Whole-grain or whole-wheat bread. Whole-grain or whole-wheat pasta. Brown rice. Orpah Cobb. Bulgur. Whole-grain and low-sodium cereals. Pita bread. Low-fat, low-sodium crackers. Whole-wheat flour tortillas. Meats and other proteins Skinless chicken or [place]. Ground chicken or [place]. Pork with fat trimmed off. Fish and seafood. Egg whites. Dried beans, peas, or lentils. Unsalted nuts, nut butters, and seeds. Unsalted canned beans. Lean cuts of beef with fat trimmed off. Low-sodium, lean precooked or cured meat, such as sausages or meat loaves. Dairy Low-fat (1%) or fat-free (skim) milk. Reduced-fat, low-fat, or fat-free cheeses. Nonfat, low-sodium ricotta or cottage cheese. Low-fat or nonfat yogurt. Low-fat, low-sodium cheese. Fats and oils Soft margarine without trans fats. Vegetable oil. Reduced-fat, low-fat, or light mayonnaise and salad dressings (reduced-sodium). Canola, safflower, olive, avocado, soybean, and sunflower oils. Avocado. Seasonings and condiments Herbs. Spices. Seasoning mixes without salt. Other foods Unsalted popcorn and pretzels. Fat-free sweets. The items listed above may not be a complete list of foods and beverages you can eat. Contact a dietitian for more information. What foods should I avoid? Fruits Canned fruit in a light or heavy syrup. Fried fruit. Fruit in cream or butter sauce. Vegetables Creamed or fried vegetables. Vegetables in a cheese sauce. Regular canned vegetables (not low-sodium or reduced-sodium). Regular canned tomato sauce and paste (not low-sodium or reduced-sodium). Regular tomato and vegetable juice (not low-sodium or reduced-sodium). Rosita Fire. Olives. Grains Baked goods made with fat, such as croissants, muffins, or some breads. Dry pasta or rice meal packs. Meats and other proteins Fatty cuts of meat. Ribs. Fried meat. Tomasa Blase.  Bologna, salami, and other precooked or cured meats, such as sausages or meat loaves. Fat from the back of a pig (fatback). Bratwurst. Salted nuts and seeds. Canned beans with added salt. Canned or smoked fish. Whole eggs or egg yolks. Chicken or [place] with skin. Dairy Whole or 2% milk, cream, and half-and-half. Whole or full-fat cream cheese. Whole-fat or sweetened yogurt. Full-fat cheese. Nondairy creamers. Whipped toppings. Processed cheese and cheese spreads. Fats and oils Butter. Stick margarine. Lard. Shortening. Ghee. Bacon fat. Tropical oils, such as coconut, palm kernel, or palm oil. Seasonings and condiments Onion salt, garlic salt, seasoned salt, table salt, and sea salt. Worcestershire sauce. Tartar sauce. Barbecue sauce. Teriyaki sauce. Soy sauce, including reduced-sodium. Steak sauce. Canned and packaged gravies. Fish sauce. Oyster sauce. Cocktail sauce. Store-bought horseradish. Ketchup. Mustard. Meat flavorings and tenderizers. Bouillon cubes. Hot sauces. Pre-made or packaged marinades. Pre-made or packaged taco seasonings. Relishes. Regular salad dressings. Other foods Salted popcorn and pretzels. The items listed above may not be a complete list of foods and beverages you should avoid. Contact a dietitian for more information. Where to find more information  National Heart, Lung, and Blood Institute: PopSteam.is  American Heart Association: www.heart.org  Academy of Nutrition and Dietetics: www.eatright.org  National Kidney Foundation: www.kidney.org Summary  The DASH eating  plan is a healthy eating plan that has been shown to reduce high blood pressure (hypertension). It may also reduce your risk for type 2 diabetes, heart disease, and stroke.  When on the DASH eating plan, aim to eat more fresh fruits and vegetables, whole grains, lean proteins, low-fat dairy, and heart-healthy fats.  With the DASH eating plan, you should limit salt (sodium) intake to 2,300 mg a  day. If you have hypertension, you may need to reduce your sodium intake to 1,500 mg a day.  Work with your health care provider or dietitian to adjust your eating plan to your individual calorie needs. This information is not intended to replace advice given to you by your health care provider. Make sure you discuss any questions you have with your health care provider. Document Revised: 06/25/2019 Document Reviewed: 06/25/2019 Elsevier Patient Education  2021 [employer].

## 2020-11-04 NOTE — Assessment & Plan Note (Addendum)
Office readings remain elevated despite 3 drug therapy (chlorthalidone, losartan and metoprolol). Home readings are lower.  2ndary causes discussed.  He has had a negative Renal artery doppler for RAS per patient done by previous PCP in [place] (records requested again) ,  And does not snore. y. Home readings are 130/80 or less.  He has been asked to calibrate his home machine with ours.  No changes today

## 2020-11-04 NOTE — Assessment & Plan Note (Signed)
Has been diet controlled for years.  No indication for anti hyperglycemics,  But weight loss ,  Continued use of Zetia (statin intolerant) and ARB advised   Lab Results  Component Value Date   HGBA1C 6.1 11/02/2020   Lab Results  Component Value Date   MICROALBUR <0.7 07/05/2020   MICROALBUR <0.7 10/07/2016

## 2020-11-04 NOTE — Assessment & Plan Note (Signed)
Triglycerides are < 300 and  direct LDL is < 100.  Continue Zetia ,   low glycemic index diet, weight  loss and regular exercise.  Advised t o  repeat in 6 months.   Lab Results  Component Value Date   CHOL 172 07/05/2020   HDL 36.90 (L) 07/05/2020   LDLCALC 120 (H) 01/09/2015   LDLDIRECT 86.0 07/05/2020   TRIG 256.0 (H) 07/05/2020   CHOLHDL 5 07/05/2020

## 2020-12-23 ENCOUNTER — Other Ambulatory Visit: Payer: Self-pay | Admitting: Internal Medicine

## 2021-01-07 ENCOUNTER — Other Ambulatory Visit: Payer: Self-pay | Admitting: Internal Medicine

## 2021-02-02 ENCOUNTER — Telehealth: Payer: Self-pay | Admitting: Internal Medicine

## 2021-02-02 ENCOUNTER — Other Ambulatory Visit: Payer: Self-pay | Admitting: Internal Medicine

## 2021-02-02 MED ORDER — EZETIMIBE 10 MG PO TABS: 10.0000 mg | ORAL_TABLET | Freq: Every day | ORAL | 0 refills | Status: DC

## 2021-02-02 MED ORDER — LOSARTAN POTASSIUM 50 MG PO TABS: 100.0000 mg | ORAL_TABLET | Freq: Every evening | ORAL | 0 refills | Status: DC

## 2021-02-02 NOTE — Telephone Encounter (Signed)
Patient needs the follow refills; losartan (COZAAR) 50 MG tablet,  ezetimibe (ZETIA) 10 MG tablet. Patient is out of his losartan.

## 2021-02-02 NOTE — Addendum Note (Signed)
Addended by: Elise Benne T on: 02/02/2021 01:53 PM   Modules accepted: Orders

## 2021-02-02 NOTE — Addendum Note (Signed)
Addended by: Elise Benne T on: 02/02/2021 01:52 PM   Modules accepted: Orders

## 2021-03-12 ENCOUNTER — Telehealth: Payer: Self-pay | Admitting: Internal Medicine

## 2021-03-12 ENCOUNTER — Other Ambulatory Visit: Payer: Self-pay | Admitting: Internal Medicine

## 2021-03-12 MED ORDER — PANTOPRAZOLE SODIUM 40 MG PO TBEC: 40.0000 mg | DELAYED_RELEASE_TABLET | Freq: Every day | ORAL | 0 refills | Status: DC

## 2021-03-12 MED ORDER — CHLORTHALIDONE 25 MG PO TABS: 25.0000 mg | ORAL_TABLET | Freq: Every day | ORAL | 0 refills | Status: DC

## 2021-03-12 MED ORDER — METOPROLOL SUCCINATE ER 100 MG PO TB24: 100.0000 mg | ORAL_TABLET | Freq: Every day | ORAL | 0 refills | Status: DC

## 2021-03-12 NOTE — Telephone Encounter (Signed)
Patient is requesting the follow ing refills; metoprolol succinate (TOPROL-XL) 100 MG 24 hr tablet, pantoprazole (PROTONIX) 40 MG tablet, and chlorthalidone (HYGROTON) 25 MG tablet. Patient is out of his medication.

## 2021-03-13 NOTE — Telephone Encounter (Signed)
Patients wife called to check on the status of the refills below.

## 2021-03-13 NOTE — Telephone Encounter (Signed)
All refills have been sent accordingly

## 2021-04-02 ENCOUNTER — Other Ambulatory Visit: Payer: Self-pay | Admitting: Internal Medicine

## 2021-04-06 ENCOUNTER — Encounter: Payer: Self-pay | Admitting: *Deleted

## 2021-04-06 ENCOUNTER — Telehealth: Payer: Self-pay | Admitting: Internal Medicine

## 2021-04-06 ENCOUNTER — Other Ambulatory Visit: Payer: Self-pay

## 2021-04-06 MED ORDER — LOSARTAN POTASSIUM 50 MG PO TABS: 100.0000 mg | ORAL_TABLET | Freq: Every evening | ORAL | 2 refills | Status: DC

## 2021-04-06 MED ORDER — LOSARTAN POTASSIUM 100 MG PO TABS: 100.0000 mg | ORAL_TABLET | Freq: Every evening | ORAL | 1 refills | Status: DC

## 2021-04-06 NOTE — Telephone Encounter (Signed)
Patient's wife called. Medication was filled wrong. Patient wants 1- 100mg  tablet dailey, NOT 2 -50mg  tablets. Also Patient is out of his medication. Please call if there is a problem. Thanks.

## 2021-04-06 NOTE — Telephone Encounter (Signed)
Patient's wife is calling to check on the status of a refill the patients pharmacy requested on Monday for his losartan 100 mg. He is currently out of this medicine,please advise.

## 2021-04-06 NOTE — Telephone Encounter (Signed)
Medication has been refilled.

## 2021-05-04 ENCOUNTER — Ambulatory Visit: Payer: 59 | Admitting: Internal Medicine

## 2021-05-07 ENCOUNTER — Other Ambulatory Visit: Payer: Self-pay | Admitting: Internal Medicine

## 2021-06-07 NOTE — Telephone Encounter (Signed)
Verified Once daily Losartan

## 2021-06-09 ENCOUNTER — Other Ambulatory Visit: Payer: Self-pay | Admitting: Internal Medicine

## 2021-06-12 ENCOUNTER — Telehealth: Payer: Self-pay | Admitting: Internal Medicine

## 2021-06-12 MED ORDER — EZETIMIBE 10 MG PO TABS: 10.0000 mg | ORAL_TABLET | Freq: Every day | ORAL | 0 refills | Status: DC

## 2021-06-12 NOTE — Telephone Encounter (Signed)
Patient has an appointment with Dr Darrick Huntsman on 06/18/21. He needs his ezetimibe (ZETIA) 10 MG tablet refilled before appointment. [facility] in [place].

## 2021-06-18 ENCOUNTER — Ambulatory Visit: Payer: 59 | Admitting: Internal Medicine

## 2021-07-03 ENCOUNTER — Telehealth: Payer: Self-pay

## 2021-07-03 NOTE — Telephone Encounter (Signed)
LMTCB. Need to find out if pt is using [employer] order. Also need to schedule pt for a diabetes follow up.

## 2021-07-04 MED ORDER — METOPROLOL SUCCINATE ER 100 MG PO TB24: 100.0000 mg | ORAL_TABLET | Freq: Every day | ORAL | 0 refills | Status: DC

## 2021-07-04 MED ORDER — PANTOPRAZOLE SODIUM 40 MG PO TBEC: 40.0000 mg | DELAYED_RELEASE_TABLET | Freq: Every day | ORAL | 0 refills | Status: DC

## 2021-07-04 MED ORDER — CHLORTHALIDONE 25 MG PO TABS: 25.0000 mg | ORAL_TABLET | Freq: Every day | ORAL | 0 refills | Status: DC

## 2021-07-04 MED ORDER — EZETIMIBE 10 MG PO TABS: 10.0000 mg | ORAL_TABLET | Freq: Every day | ORAL | 0 refills | Status: DC

## 2021-07-04 NOTE — Addendum Note (Signed)
Addended by: Sandy Salaam on: 07/04/2021 12:12 PM   Modules accepted: Orders

## 2021-07-04 NOTE — Telephone Encounter (Signed)
Medications have been refilled for 90 days.

## 2021-07-04 NOTE — Telephone Encounter (Signed)
Patient still uses [employer] order. He will call back to schedule diabetic follow up after his parodontal visits.

## 2021-07-14 ENCOUNTER — Encounter: Payer: Self-pay | Admitting: Emergency Medicine

## 2021-07-14 ENCOUNTER — Inpatient Hospital Stay
Admission: EM | Admit: 2021-07-14 | Discharge: 2021-07-17 | DRG: 287 | Payer: PRIVATE HEALTH INSURANCE | Attending: Internal Medicine | Admitting: Internal Medicine

## 2021-07-14 ENCOUNTER — Other Ambulatory Visit: Payer: Self-pay

## 2021-07-14 DIAGNOSIS — Z803 Family history of malignant neoplasm of breast: Secondary | ICD-10-CM

## 2021-07-14 DIAGNOSIS — Z825 Family history of asthma and other chronic lower respiratory diseases: Secondary | ICD-10-CM

## 2021-07-14 DIAGNOSIS — E876 Hypokalemia: Secondary | ICD-10-CM

## 2021-07-14 DIAGNOSIS — I160 Hypertensive urgency: Secondary | ICD-10-CM

## 2021-07-14 DIAGNOSIS — R001 Bradycardia, unspecified: Secondary | ICD-10-CM | POA: Diagnosis present

## 2021-07-14 DIAGNOSIS — K219 Gastro-esophageal reflux disease without esophagitis: Secondary | ICD-10-CM | POA: Diagnosis present

## 2021-07-14 DIAGNOSIS — I248 Other forms of acute ischemic heart disease: Secondary | ICD-10-CM | POA: Diagnosis present

## 2021-07-14 DIAGNOSIS — Z833 Family history of diabetes mellitus: Secondary | ICD-10-CM

## 2021-07-14 DIAGNOSIS — E119 Type 2 diabetes mellitus without complications: Secondary | ICD-10-CM

## 2021-07-14 DIAGNOSIS — I25110 Atherosclerotic heart disease of native coronary artery with unstable angina pectoris: Secondary | ICD-10-CM | POA: Diagnosis not present

## 2021-07-14 DIAGNOSIS — R778 Other specified abnormalities of plasma proteins: Secondary | ICD-10-CM

## 2021-07-14 DIAGNOSIS — Z87891 Personal history of nicotine dependence: Secondary | ICD-10-CM

## 2021-07-14 DIAGNOSIS — Z800 Family history of malignant neoplasm of digestive organs: Secondary | ICD-10-CM

## 2021-07-14 DIAGNOSIS — Z6833 Body mass index (BMI) 33.0-33.9, adult: Secondary | ICD-10-CM

## 2021-07-14 DIAGNOSIS — Z79899 Other long term (current) drug therapy: Secondary | ICD-10-CM

## 2021-07-14 DIAGNOSIS — I442 Atrioventricular block, complete: Principal | ICD-10-CM

## 2021-07-14 DIAGNOSIS — Z20822 Contact with and (suspected) exposure to covid-19: Secondary | ICD-10-CM | POA: Diagnosis present

## 2021-07-14 DIAGNOSIS — Z888 Allergy status to other drugs, medicaments and biological substances status: Secondary | ICD-10-CM

## 2021-07-14 DIAGNOSIS — Z8249 Family history of ischemic heart disease and other diseases of the circulatory system: Secondary | ICD-10-CM

## 2021-07-14 DIAGNOSIS — Z8349 Family history of other endocrine, nutritional and metabolic diseases: Secondary | ICD-10-CM

## 2021-07-14 DIAGNOSIS — R7989 Other specified abnormal findings of blood chemistry: Secondary | ICD-10-CM

## 2021-07-14 DIAGNOSIS — E669 Obesity, unspecified: Secondary | ICD-10-CM | POA: Diagnosis present

## 2021-07-14 DIAGNOSIS — I10 Essential (primary) hypertension: Secondary | ICD-10-CM | POA: Diagnosis present

## 2021-07-14 DIAGNOSIS — I493 Ventricular premature depolarization: Secondary | ICD-10-CM

## 2021-07-14 DIAGNOSIS — E785 Hyperlipidemia, unspecified: Secondary | ICD-10-CM | POA: Diagnosis present

## 2021-07-14 NOTE — ED Triage Notes (Signed)
Pt states feels light headed and has had some SHOB today, states take Metoprolol at home and today noted that his HR was in the 40's. Pt states did not take his metoprolol today. Pt reports has had heartburn since eating barbeque today for lunch.    Pt's wife reports that patient had tooth extraction on Thursday.

## 2021-07-15 ENCOUNTER — Emergency Department: Payer: PRIVATE HEALTH INSURANCE

## 2021-07-15 DIAGNOSIS — E876 Hypokalemia: Secondary | ICD-10-CM

## 2021-07-15 DIAGNOSIS — Z888 Allergy status to other drugs, medicaments and biological substances status: Secondary | ICD-10-CM | POA: Diagnosis not present

## 2021-07-15 DIAGNOSIS — R778 Other specified abnormalities of plasma proteins: Secondary | ICD-10-CM | POA: Diagnosis not present

## 2021-07-15 DIAGNOSIS — I25110 Atherosclerotic heart disease of native coronary artery with unstable angina pectoris: Secondary | ICD-10-CM | POA: Diagnosis not present

## 2021-07-15 DIAGNOSIS — E119 Type 2 diabetes mellitus without complications: Secondary | ICD-10-CM

## 2021-07-15 DIAGNOSIS — Z8249 Family history of ischemic heart disease and other diseases of the circulatory system: Secondary | ICD-10-CM | POA: Diagnosis not present

## 2021-07-15 DIAGNOSIS — I442 Atrioventricular block, complete: Principal | ICD-10-CM

## 2021-07-15 DIAGNOSIS — E669 Obesity, unspecified: Secondary | ICD-10-CM | POA: Diagnosis present

## 2021-07-15 DIAGNOSIS — Z87891 Personal history of nicotine dependence: Secondary | ICD-10-CM | POA: Diagnosis not present

## 2021-07-15 DIAGNOSIS — Z79899 Other long term (current) drug therapy: Secondary | ICD-10-CM | POA: Diagnosis not present

## 2021-07-15 DIAGNOSIS — I248 Other forms of acute ischemic heart disease: Secondary | ICD-10-CM | POA: Diagnosis present

## 2021-07-15 DIAGNOSIS — Z8349 Family history of other endocrine, nutritional and metabolic diseases: Secondary | ICD-10-CM | POA: Diagnosis not present

## 2021-07-15 DIAGNOSIS — Z803 Family history of malignant neoplasm of breast: Secondary | ICD-10-CM | POA: Diagnosis not present

## 2021-07-15 DIAGNOSIS — I200 Unstable angina: Secondary | ICD-10-CM | POA: Diagnosis not present

## 2021-07-15 DIAGNOSIS — K219 Gastro-esophageal reflux disease without esophagitis: Secondary | ICD-10-CM | POA: Diagnosis present

## 2021-07-15 DIAGNOSIS — Z825 Family history of asthma and other chronic lower respiratory diseases: Secondary | ICD-10-CM | POA: Diagnosis not present

## 2021-07-15 DIAGNOSIS — R001 Bradycardia, unspecified: Secondary | ICD-10-CM | POA: Diagnosis present

## 2021-07-15 DIAGNOSIS — I493 Ventricular premature depolarization: Secondary | ICD-10-CM

## 2021-07-15 DIAGNOSIS — I160 Hypertensive urgency: Secondary | ICD-10-CM

## 2021-07-15 DIAGNOSIS — I10 Essential (primary) hypertension: Secondary | ICD-10-CM | POA: Diagnosis present

## 2021-07-15 DIAGNOSIS — Z6833 Body mass index (BMI) 33.0-33.9, adult: Secondary | ICD-10-CM | POA: Diagnosis not present

## 2021-07-15 DIAGNOSIS — E785 Hyperlipidemia, unspecified: Secondary | ICD-10-CM | POA: Diagnosis present

## 2021-07-15 DIAGNOSIS — I2510 Atherosclerotic heart disease of native coronary artery without angina pectoris: Secondary | ICD-10-CM | POA: Diagnosis not present

## 2021-07-15 DIAGNOSIS — Z833 Family history of diabetes mellitus: Secondary | ICD-10-CM | POA: Diagnosis not present

## 2021-07-15 DIAGNOSIS — Z20822 Contact with and (suspected) exposure to covid-19: Secondary | ICD-10-CM | POA: Diagnosis present

## 2021-07-15 DIAGNOSIS — Z800 Family history of malignant neoplasm of digestive organs: Secondary | ICD-10-CM | POA: Diagnosis not present

## 2021-07-15 LAB — CBC WITH DIFFERENTIAL/PLATELET
Abs Immature Granulocytes: 0.04 10*3/uL (ref 0.00–0.07)
Basophils Absolute: 0.1 10*3/uL (ref 0.0–0.1)
Basophils Relative: 1 %
Eosinophils Absolute: 0.4 10*3/uL (ref 0.0–0.5)
Eosinophils Relative: 3 %
HCT: 44.2 % (ref 39.0–52.0)
Hemoglobin: 15.7 g/dL (ref 13.0–17.0)
Immature Granulocytes: 0 %
Lymphocytes Relative: 35 %
Lymphs Abs: 4.1 10*3/uL — ABNORMAL HIGH (ref 0.7–4.0)
MCH: 29.8 pg (ref 26.0–34.0)
MCHC: 35.5 g/dL (ref 30.0–36.0)
MCV: 83.9 fL (ref 80.0–100.0)
Monocytes Absolute: 0.9 10*3/uL (ref 0.1–1.0)
Monocytes Relative: 8 %
Neutro Abs: 6.4 10*3/uL (ref 1.7–7.7)
Neutrophils Relative %: 53 %
Platelets: 274 10*3/uL (ref 150–400)
RBC: 5.27 MIL/uL (ref 4.22–5.81)
RDW: 13 % (ref 11.5–15.5)
WBC: 11.9 10*3/uL — ABNORMAL HIGH (ref 4.0–10.5)
nRBC: 0 % (ref 0.0–0.2)

## 2021-07-15 LAB — HIV ANTIBODY (ROUTINE TESTING W REFLEX): HIV Screen 4th Generation wRfx: NONREACTIVE

## 2021-07-15 LAB — BASIC METABOLIC PANEL
Anion gap: 10 (ref 5–15)
Anion gap: 9 (ref 5–15)
BUN: 17 mg/dL (ref 6–20)
BUN: 22 mg/dL — ABNORMAL HIGH (ref 6–20)
CO2: 26 mmol/L (ref 22–32)
CO2: 26 mmol/L (ref 22–32)
Calcium: 9.3 mg/dL (ref 8.9–10.3)
Calcium: 9.5 mg/dL (ref 8.9–10.3)
Chloride: 103 mmol/L (ref 98–111)
Chloride: 98 mmol/L (ref 98–111)
Creatinine, Ser: 0.69 mg/dL (ref 0.61–1.24)
Creatinine, Ser: 0.84 mg/dL (ref 0.61–1.24)
GFR, Estimated: >60 mL/min (ref >60)
GFR, Estimated: >60 mL/min (ref >60)
Glucose, Bld: 132 mg/dL — ABNORMAL HIGH (ref 70–99)
Glucose, Bld: 143 mg/dL — ABNORMAL HIGH (ref 70–99)
Potassium: 3.1 mmol/L — ABNORMAL LOW (ref 3.5–5.1)
Potassium: 3.3 mmol/L — ABNORMAL LOW (ref 3.5–5.1)
Sodium: 134 mmol/L — ABNORMAL LOW (ref 135–145)
Sodium: 138 mmol/L (ref 135–145)

## 2021-07-15 LAB — RESP PANEL BY RT-PCR (FLU A&B, COVID) ARPGX2
Influenza A by PCR: NEGATIVE
Influenza B by PCR: NEGATIVE
SARS Coronavirus 2 by RT PCR: NEGATIVE

## 2021-07-15 LAB — POTASSIUM: Potassium: 3.2 mmol/L — ABNORMAL LOW (ref 3.5–5.1)

## 2021-07-15 LAB — T4, FREE: Free T4: 0.76 ng/dL (ref 0.61–1.12)

## 2021-07-15 LAB — TROPONIN I (HIGH SENSITIVITY)
Troponin I (High Sensitivity): 58 ng/L — ABNORMAL HIGH (ref <18)
Troponin I (High Sensitivity): 73 ng/L — ABNORMAL HIGH (ref <18)
Troponin I (High Sensitivity): 85 ng/L — ABNORMAL HIGH (ref <18)
Troponin I (High Sensitivity): 91 ng/L — ABNORMAL HIGH (ref <18)

## 2021-07-15 LAB — TSH: TSH: 4.692 u[IU]/mL — ABNORMAL HIGH (ref 0.350–4.500)

## 2021-07-15 LAB — CBG MONITORING, ED
Glucose-Capillary: 147 mg/dL — ABNORMAL HIGH (ref 70–99)
Glucose-Capillary: 123 mg/dL — ABNORMAL HIGH (ref 70–99)

## 2021-07-15 LAB — MAGNESIUM
Magnesium: 1.9 mg/dL (ref 1.7–2.4)
Magnesium: 2 mg/dL (ref 1.7–2.4)
Magnesium: 2 mg/dL (ref 1.7–2.4)

## 2021-07-15 MED ORDER — POTASSIUM CHLORIDE CRYS ER 20 MEQ PO TBCR
40.0000 meq | EXTENDED_RELEASE_TABLET | Freq: Once | ORAL | Status: AC
  Administered 2021-07-15: 40 meq via ORAL
  Filled 2021-07-15: qty 2

## 2021-07-15 MED ORDER — ASPIRIN 81 MG PO CHEW
324.0000 mg | CHEWABLE_TABLET | Freq: Once | ORAL | Status: AC
  Administered 2021-07-15: 324 mg via ORAL
  Filled 2021-07-15: qty 4

## 2021-07-15 MED ORDER — AMLODIPINE BESYLATE 5 MG PO TABS
5.0000 mg | ORAL_TABLET | Freq: Every day | ORAL | Status: DC
  Administered 2021-07-15: 5 mg via ORAL
  Filled 2021-07-15 (×3): qty 1

## 2021-07-15 MED ORDER — MAGNESIUM OXIDE -MG SUPPLEMENT 400 (240 MG) MG PO TABS
400.0000 mg | ORAL_TABLET | Freq: Once | ORAL | Status: AC
  Administered 2021-07-15: 400 mg via ORAL
  Filled 2021-07-15: qty 1

## 2021-07-15 MED ORDER — PANTOPRAZOLE SODIUM 40 MG PO TBEC
40.0000 mg | DELAYED_RELEASE_TABLET | Freq: Every day | ORAL | Status: DC
  Administered 2021-07-15 – 2021-07-17 (×3): 40 mg via ORAL
  Filled 2021-07-15 (×4): qty 1

## 2021-07-15 MED ORDER — ONDANSETRON HCL 4 MG PO TABS: 4.0000 mg | ORAL_TABLET | Freq: Four times a day (QID) | ORAL | Status: DC | PRN

## 2021-07-15 MED ORDER — HYDRALAZINE HCL 20 MG/ML IJ SOLN
10.0000 mg | Freq: Four times a day (QID) | INTRAMUSCULAR | Status: DC | PRN
  Administered 2021-07-16: 10 mg via INTRAVENOUS
  Filled 2021-07-15 (×3): qty 1

## 2021-07-15 MED ORDER — EZETIMIBE 10 MG PO TABS: 10.0000 mg | ORAL_TABLET | Freq: Every day | ORAL | Status: DC

## 2021-07-15 MED ORDER — ONDANSETRON HCL 4 MG/2ML IJ SOLN: 4.0000 mg | Freq: Four times a day (QID) | INTRAMUSCULAR | Status: DC | PRN

## 2021-07-15 MED ORDER — ACETAMINOPHEN 650 MG RE SUPP: 650.0000 mg | Freq: Four times a day (QID) | RECTAL | Status: DC | PRN

## 2021-07-15 MED ORDER — HYDRALAZINE HCL 50 MG PO TABS
25.0000 mg | ORAL_TABLET | Freq: Three times a day (TID) | ORAL | Status: DC
  Filled 2021-07-15: qty 1

## 2021-07-15 MED ORDER — LOSARTAN POTASSIUM 50 MG PO TABS
100.0000 mg | ORAL_TABLET | Freq: Every day | ORAL | Status: DC
  Administered 2021-07-15 – 2021-07-17 (×3): 100 mg via ORAL
  Filled 2021-07-15 (×3): qty 2

## 2021-07-15 MED ORDER — MAGNESIUM OXIDE -MG SUPPLEMENT 400 (240 MG) MG PO TABS
400.0000 mg | ORAL_TABLET | Freq: Two times a day (BID) | ORAL | Status: DC
  Administered 2021-07-15 – 2021-07-17 (×5): 400 mg via ORAL
  Filled 2021-07-15 (×7): qty 1

## 2021-07-15 MED ORDER — ACETAMINOPHEN 325 MG PO TABS: 650.0000 mg | ORAL_TABLET | Freq: Four times a day (QID) | ORAL | Status: DC | PRN

## 2021-07-15 MED ORDER — FAMOTIDINE 20 MG PO TABS
20.0000 mg | ORAL_TABLET | Freq: Every day | ORAL | Status: DC | PRN
  Administered 2021-07-15 – 2021-07-17 (×3): 20 mg via ORAL
  Filled 2021-07-15 (×3): qty 1

## 2021-07-15 MED ORDER — INSULIN ASPART 100 UNIT/ML IJ SOLN: 0.0000 [IU] | Freq: Every day | INTRAMUSCULAR | Status: DC

## 2021-07-15 MED ORDER — INSULIN ASPART 100 UNIT/ML IJ SOLN
0.0000 [IU] | Freq: Three times a day (TID) | INTRAMUSCULAR | Status: DC
  Administered 2021-07-16: 3 [IU] via SUBCUTANEOUS
  Filled 2021-07-15: qty 1

## 2021-07-15 MED ORDER — LOSARTAN POTASSIUM 50 MG PO TABS: 100.0000 mg | ORAL_TABLET | Freq: Every evening | ORAL | Status: DC

## 2021-07-15 MED ORDER — POTASSIUM CHLORIDE 20 MEQ PO PACK
40.0000 meq | PACK | Freq: Once | ORAL | Status: AC
  Administered 2021-07-15: 40 meq via ORAL
  Filled 2021-07-15: qty 2

## 2021-07-15 MED ORDER — ENOXAPARIN SODIUM 60 MG/0.6ML IJ SOSY
0.5000 mg/kg | PREFILLED_SYRINGE | INTRAMUSCULAR | Status: DC
  Filled 2021-07-15: qty 0.6
  Filled 2021-07-15 (×2): qty 0.55

## 2021-07-15 NOTE — ED Notes (Signed)
Pt is not a diagnosed diabetic and asked could we stop checking his sugar so much. Will skip 5pm BGL check.

## 2021-07-15 NOTE — ED Provider Notes (Addendum)
[facility] Emergency Department Provider Note  ____________________________________________   Event Date/Time   First MD Initiated Contact with Patient 07/14/21 2357     (approximate)  I have reviewed the triage vital signs and the nursing notes.   HISTORY  Chief Complaint Bradycardia    HPI Alec Ali. is a 60 y.o. male with history of hypertension, diabetes who presents to the emergency department hyperlipidemia, GERD for concerns for bradycardia.  States he monitors his heart rate on his watch and became concerned today when he saw his heart rate drop as low as 34.  He states he has had some "acid reflux" after eating barbecue at lunch that he describes as feeling like a burning pain.  No tightness or pressure.  Has had some shortness of breath and intermittent dizziness.  According to his watch it looks like his heart rate normally runs in the 50s to 90s and sometimes goes into the upper 40s.  He has been on Toprol-XL 100 mg for 4 years and denies any changes in this medication.  He states he last took the medication Friday morning 07/13/21 at 8 AM.  He did not take it today when he saw that his heart rate was running so low.  No previous history of CAD, MI.  He states his last stress test was in 2012.  Was previously seen by Dr. Mariah Milling with cardiology.  States he is on losartan and chlorthalidone for blood pressure.  No other new medications.  Wife states he had a tooth pulled on Thursday and she was concerned this could be contributing to his symptoms.  No fevers.  No history of cardiac surgery, valve replacement or repair.         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   Visit for preventive health examination 07/18/2014   DM (diabetes mellitus), type 2 (HCC) 01/03/2013   Unspecified vitamin D deficiency 10/05/2012   Obesity (BMI 30.0-34.9) 10/04/2012   GERD (gastroesophageal reflux disease) 10/04/2012   Hyperlipidemia associated with type 2 diabetes mellitus (HCC) 09/13/2010   White coat syndrome with diagnosis of hypertension 09/13/2010   Normal cardiac stress test 09/13/2010    Past Surgical History:  Procedure Laterality Date   INGUINAL HERNIA REPAIR  1987   bilateral   TONSILLECTOMY      Prior to Admission medications   Medication Sig Start Date End Date Taking? Authorizing Provider  chlorthalidone (HYGROTON) 25 MG tablet Take 1 tablet (25 mg total) by mouth daily. 07/04/21   Sherlene Shams, MD  ezetimibe (ZETIA) 10 MG tablet Take 1 tablet (10 mg total) by mouth daily. 06/12/21   Sherlene Shams, MD  ezetimibe (ZETIA) 10 MG tablet Take 1 tablet (10 mg total) by mouth daily. 07/04/21   Sherlene Shams, MD  losartan (COZAAR) 100 MG tablet Take 1 tablet (100 mg total) by mouth every evening. 04/06/21   Sherlene Shams, MD  metoprolol succinate (TOPROL-XL) 100 MG 24 hr tablet Take 1 tablet (100 mg total) by mouth daily. Take with  or immediately following a meal. 07/04/21   Sherlene Shams, MD  Multiple Vitamins-Minerals (MENS 50+ MULTI VITAMIN/MIN) TABS Take 1 tablet by mouth in the morning and at bedtime.    [provider]  pantoprazole (PROTONIX) 40 MG tablet Take 1 tablet (40 mg total) by mouth daily. 07/04/21   Sherlene Shams, MD    Allergies Lisinopril  Family History  Problem Relation Age of Onset   Breast cancer Mother    Diabetes Mother    Thyroid disease Mother    Hypertension Father    COPD Father    Colon cancer Maternal Grandmother 84    Social History Social History   Tobacco Use    Smoking status: Former   Smokeless tobacco: Former    Types: Snuff  Substance Use Topics   Alcohol use: Yes    Comment: Occasional   Drug use: No    Review of Systems Constitutional: No fever. Eyes: No visual changes. ENT: No sore throat. Cardiovascular: +  chest pain. Respiratory: +  shortness of breath. Gastrointestinal: No nausea, vomiting, diarrhea. Genitourinary: Negative for dysuria. Musculoskeletal: Negative for back pain. Skin: Negative for rash. Neurological: Negative for focal weakness or numbness.  ____________________________________________   PHYSICAL EXAM:  VITAL SIGNS: ED Triage Vitals  Enc Vitals Group     BP 07/14/21 2345 (!) 217/52     Pulse Rate 07/14/21 2345 (!) 32     Resp --      Temp 07/14/21 2345 98 F (36.7 C)     Temp Source 07/14/21 2345 Oral     SpO2 07/14/21 2345 95 %     Weight 07/14/21 2342 240 lb (108.9 kg)     Height 07/14/21 2342 5\' 10"  (1.778 m)     Head Circumference --      Peak Flow --      Pain Score 07/14/21 2342 0     Pain Loc --      Pain Edu? --      Excl. in GC? --    CONSTITUTIONAL: Alert and oriented and responds appropriately to questions. Well-appearing; well-nourished HEAD: Normocephalic EYES: Conjunctivae clear, pupils appear equal, EOM appear intact ENT: normal nose; moist mucous membranes NECK: Supple, normal ROM CARD: Bradycardic with frequent PVCs; S1 and S2 appreciated; no murmurs, no clicks, no rubs, no gallops RESP: Normal chest excursion without splinting or tachypnea; breath sounds clear and equal bilaterally; no wheezes, no rhonchi, no rales, no hypoxia or respiratory distress, speaking full sentences ABD/GI: Normal bowel sounds; non-distended; soft, non-tender, no rebound, no guarding, no peritoneal signs, no hepatosplenomegaly BACK: The back appears normal EXT: Normal ROM in all joints; no deformity noted, no edema; no cyanosis, no calf tenderness or calf swelling, extremities warm and  well-perfused SKIN: Normal color for age and race; warm; no rash on exposed skin NEURO: Moves all extremities equally PSYCH: The patient's mood and manner are appropriate.  ____________________________________________   LABS (all labs ordered are listed, but only abnormal results are displayed)  Labs Reviewed  CBC WITH DIFFERENTIAL/PLATELET - Abnormal; Notable for the following components:      Result Value   WBC 11.9 (*)    Lymphs Abs 4.1 (*)    All other components within normal limits  BASIC METABOLIC PANEL - Abnormal; Notable for the following components:   Sodium 134 (*)    Potassium 3.1 (*)    Glucose, Bld 132 (*)    BUN 22 (*)    All other components within normal  limits  TSH - Abnormal; Notable for the following components:   TSH 4.692 (*)    All other components within normal limits  TROPONIN I (HIGH SENSITIVITY) - Abnormal; Notable for the following components:   Troponin I (High Sensitivity) 58 (*)    All other components within normal limits  RESP PANEL BY RT-PCR (FLU A&B, COVID) ARPGX2  MAGNESIUM  T4, FREE  TROPONIN I (HIGH SENSITIVITY)   ____________________________________________  EKG        EKG Interpretation  Date/Time:  Sunday July 15 2021 00:55:55 EST Ventricular Rate:  70 PR Interval:  228 QRS Duration: 178 QT Interval:  681 QTC Calculation: 556 R Axis:   -4 Text Interpretation: Third degree heart block Atrial premature complexes in couplets Prolonged PR interval Left atrial enlargement Left bundle branch block Confirmed by Rochele Raring [phone]) on 07/15/2021 1:33:31 AM        ____________________________________________  RADIOLOGY Normajean Baxter Paxten Appelt, personally viewed and evaluated these images (plain radiographs) as part of my medical decision making, as well as reviewing the written report by the radiologist.  ED MD interpretation: Chest x-ray clear.  Official radiology report(s): DG Chest Portable 1 View  Result Date:  07/15/2021 CLINICAL DATA:  Chest pain and shortness of breath. EXAM: PORTABLE CHEST 1 VIEW COMPARISON:  Chest radiograph dated 08/29/2014. FINDINGS: No focal consolidation, pleural effusion, or pneumothorax. Borderline cardiomegaly. No acute osseous pathology the IMPRESSION: No active disease. Electronically Signed   By: Elgie Collard M.D.   On: 07/15/2021 01:15    ____________________________________________   PROCEDURES  Procedure(s) performed (including Critical Care):  .1-3 Lead EKG Interpretation Performed by: Robinette Esters, Layla Maw, DO Authorized by: Pedro Whiters, Layla Maw, DO     Interpretation: abnormal     ECG rate:  39   ECG rate assessment: bradycardic     Rhythm: A-V block     Ectopy: PVCs     Conduction: abnormal     Abnormal conduction: 3rd degree AV block    CRITICAL CARE Performed by: Rochele Raring   Total critical care time: 65 minutes  Critical care time was exclusive of separately billable procedures and treating other patients.  Critical care was necessary to treat or prevent imminent or life-threatening deterioration.  Critical care was time spent personally by me on the following activities: development of treatment plan with patient and/or surrogate as well as nursing, discussions with consultants, evaluation of patient's response to treatment, examination of patient, obtaining history from patient or surrogate, ordering and performing treatments and interventions, ordering and review of laboratory studies, ordering and review of radiographic studies, pulse oximetry and re-evaluation of patient's condition.  ____________________________________________   INITIAL IMPRESSION / ASSESSMENT AND PLAN / ED COURSE  As part of my medical decision making, I reviewed the following data within the electronic MEDICAL RECORD NUMBER History obtained from family, Nursing notes reviewed and incorporated, Labs reviewed , EKG interpreted , Old EKG reviewed, Old chart reviewed, Radiograph  reviewed , Discussed with admitting physician , A consult was requested and obtained from this/these consultant(s) Cardiology, and Notes from prior ED visits         Patient here with complaints of indigestion, intermittent shortness of breath and dizziness and noting that his heart rate was in the 30s at home.  Here patient appears to be in third-degree heart block.  He has multiple PVCs but no signs of ischemia seen on EKG.  He has a new left bundle branch block.  He is on metoprolol extended release  but last dose was over 36 hours ago.  Discussed with patient that this could be related to his beta-blocker.  Based on his watch readings it looks like his heart rate normally runs in the 50s up to the 90s with occasional rates in the mid to upper 40s.  He still having some indigestion but denies tightness or pressure.  No shortness of breath, nausea, vomiting, diaphoresis or dizziness currently.  He is well-appearing, mentating well, extremities are well-perfused.  He is actually hypertensive and he states he has whitecoat syndrome.  Discussed with patient that differential includes medication reaction, ACS, thyroid dysfunction, electrolyte derangement.  Will obtain labs, chest x-ray.  Patient will need admission.  I do not feel he needs atropine currently given he is mentating well and perfusing well.  ED PROGRESS  Patient's labs show potassium of 3.1.  Will give oral replacement.  Magnesium level normal.  Troponin minimally elevated at 58.  Will give full dose aspirin.  TSH is elevated.  We will add on free T4.  Chest x-ray clear.  Patient continues to be stable without significant complaint.  Discussed case with on-call cardiologist Dr. Mariah Milling.  He suspects that this is due to metoprolol and recommends observation while not taking metoprolol to see if his heart block, bradycardia improved.  We did discuss the slightly elevated troponin in the setting of feeling like he had indigestion.  Cardiology did  not feel that this was necessarily suggestive of ACS and recommended holding heparin unless symptoms change or serial troponins became more elevated.  Cardiology will see patient in consultation.  Will discuss with medicine for admission.  Discussed patient's case with hospitalist, Dr. Para March.  I have recommended admission and patient (and family if present) agree with this plan. Admitting physician will place admission orders.   I reviewed all nursing notes, vitals, pertinent previous records and reviewed/interpreted all EKGs, lab and urine results, imaging (as available).   Repeat troponin has only gone up slightly into the 80s.  Discussed with hospitalist to agrees holding heparin at this time and just continuing to trend his troponins. ____________________________________________   FINAL CLINICAL IMPRESSION(S) / ED DIAGNOSES  Final diagnoses:  Complete heart block [facility])     ED Discharge Orders     None       *Please note:  Alec Ali. was evaluated in Emergency Department on 07/15/2021 for the symptoms described in the history of present illness. He was evaluated in the context of the global COVID-19 pandemic, which necessitated consideration that the patient might be at risk for infection with the SARS-CoV-2 virus that causes COVID-19. Institutional protocols and algorithms that pertain to the evaluation of patients at risk for COVID-19 are in a state of rapid change based on information released by regulatory bodies including the CDC and federal and state organizations. These policies and algorithms were followed during the patient's care in the ED.  Some ED evaluations and interventions may be delayed as a result of limited staffing during and the pandemic.*   Note:  This document was prepared using Dragon voice recognition software and may include unintentional dictation errors.        Pegeen Stiger, Layla Maw, DO 07/15/21 [phone]

## 2021-07-15 NOTE — ED Notes (Addendum)
RN to bedside to introduce self to pt. Pt is CAOx4 and in no distress. Pt does have pads in place but ZOLL is turned off.

## 2021-07-15 NOTE — ED Notes (Signed)
Pt with 12 beat run of Vtach, strip printed to be placed in chart, pt with noted EKG changes, 2 repeat EKG's obtained at this time and exported to the chart. Dr. Arville Care paged regarding pt's run of Vtach, pt placed on Zoll monitor at this time with pads.

## 2021-07-15 NOTE — ED Notes (Signed)
Pt placed in hospital bed for comfort.

## 2021-07-15 NOTE — Progress Notes (Signed)
Same day note  Patient seen and examined at bedside.  Patient was admitted to the hospital for bradycardia  At the time of my evaluation, patient complains of indigestion.  Denies overt shortness of breath or dizziness lightheadedness but indigestion kind of feeling.  Physical examination reveals male not in obvious distress.  Obese. Laboratory data and imaging was reviewed  Assessment and Plan.  Complete heart block  asymptomatic.  Cardiology was notified from the ED, who recommended to watch of beta-blockers and nodal blockers.  We will replenish potassium.     Elevated troponin Thought to be secondary to demand mismatch.  EKG without ST or T wave changes.  Cardiology did not recommend any anticoagulation.  Multiple PVCs Continue to replenish potassium and magnesium.  We will give additional dose of potassium this morning.  Continue oral magnesium oxide as well.     Hypertensive urgency Blood pressure 174/49 at this time.  Avoid nodal blockers.  Statin has been resumed from home.  Continue to hold chlorthalidone due to hypokalemia    Hypokalemia We will continue to replace.  Aim to keep more than 4.  Latest magnesium 1.9.  We will continue to replenish.  Elevated TSH Free T4 was 0.7 within normal range.     Diabetes mellitus type 2  continue sliding-scale insulin, Accu-Cheks, diabetic diet.  Spoke with the patient's wife at bedside  No Charge  Signed,  Tenny Craw, MD [facility]

## 2021-07-15 NOTE — ED Notes (Signed)
Pt having small runs of vtach on monitor. MD made aware.

## 2021-07-15 NOTE — ED Notes (Addendum)
Pt reports feeling flushed. Denies nausea and changes in shortness of breath. Reports midline abdominal pain rated 2-3. Wife reports pt has not has a bowel movement today. Precautionary EKG captured and exported.

## 2021-07-15 NOTE — Progress Notes (Signed)
Anticoagulation monitoring(Lovenox):  60 yo male ordered Lovenox 40 mg Q24h    Filed Weights   07/14/21 2342  Weight: 108.9 kg (240 lb)   BMI  34.4  Lab Results  Component Value Date   CREATININE 0.84 07/14/2021   CREATININE 0.85 11/02/2020   CREATININE 0.88 07/05/2020   Estimated Creatinine Clearance: 119.9 mL/min (by C-G formula based on SCr of 0.84 mg/dL). Hemoglobin & Hematocrit     Component Value Date/Time   HGB 15.7 07/14/2021 2351   HGB 15.3 07/12/2014 0011   HCT 44.2 07/14/2021 2351   HCT 44.7 07/12/2014 0011     Per Protocol for Patient with estCrcl > 30 ml/min and BMI > 30, will transition to Lovenox 55 mg Q24h.

## 2021-07-15 NOTE — ED Notes (Signed)
Patient resting in bed. Denies chest pain. Reports occasional episodes of shortness of breath at rest. Denies dizziness. Resp even, unlabored on RA. Complete heart block on monitor at 30-40. Pacer pads on. External pacer in room.

## 2021-07-15 NOTE — ED Notes (Signed)
Patient AOX4. Resp even, unlabored on RA. Denies chest pain, shortness of breath. Pacer pads on. Family member at bedside.

## 2021-07-15 NOTE — Consult Note (Signed)
Cardiology Consultation:   Patient ID: Alec Ali. MRN: [id number]; DOB: [birth year]  Admit date: 07/14/2021 Date of Consult: 07/15/2021  PCP:  Sherlene Shams, MD   [facility] Providers Cardiologist:  Dr. Mariah Milling     Patient Profile:   Alec Ali. is a 60 y.o. male with a hx of HTN, DM2, HLD who is being seen 07/15/2021 for the evaluation of bradcyardia, possible CHB at the request of Dr. Tyson Babinski.  History of Present Illness:   Alec Ali with no significant cardiac history. No h/o MI, stent, CHF. Maternal aunt with MI and bypass surgery. No h/o stroke. BP meds included losartan, chlorthalidone, and metoprolol.  The patient presented to the ER 07/15/21 for bradycardia. He noted low heart rate on his watch yesterday. Around lunch time he felt uncomforable and thought it was acid reflux. Some sob on exertion. He hds lightheadedness and dizziness last night. Last took the BB Friday morning. No significant chest pain, LLE, orthopnea, pnd, fever, chills, nausea, vomiting  In the ED pulse 32. EKG showed possible CHB, ?third degree HB, BP 217/52. Labs showed mag 2, potassium 3.3, Scr 0.69, BUN 17, TSH 4.6, WBC 11.3. HS trop 85>91>73. CXR with no active disease. BB held and admitted.   Past Medical History:  Diagnosis Date   GERD (gastroesophageal reflux disease)    EGD normal 08/08/2011 Oh    Hypertension     Past Surgical History:  Procedure Laterality Date   INGUINAL HERNIA REPAIR  1987   bilateral   TONSILLECTOMY       Home Medications:  Prior to Admission medications   Medication Sig Start Date End Date Taking? Authorizing Provider  chlorthalidone (HYGROTON) 25 MG tablet Take 1 tablet (25 mg total) by mouth daily. 07/04/21  Yes Sherlene Shams, MD  ezetimibe (ZETIA) 10 MG tablet Take 1 tablet (10 mg total) by mouth daily. 07/04/21  Yes Sherlene Shams, MD  famotidine (PEPCID AC MAXIMUM STRENGTH) 20 MG tablet Take 20 mg by mouth daily as needed for  heartburn or indigestion.   Yes [provider]  ibuprofen (ADVIL) 200 MG tablet Take 600 mg by mouth every 6 (six) hours as needed.   Yes [provider]  losartan (COZAAR) 100 MG tablet Take 1 tablet (100 mg total) by mouth every evening. 04/06/21  Yes Sherlene Shams, MD  metoprolol succinate (TOPROL-XL) 100 MG 24 hr tablet Take 1 tablet (100 mg total) by mouth daily. Take with or immediately following a meal. 07/04/21  Yes Sherlene Shams, MD  Multiple Vitamins-Minerals (MENS 50+ MULTI VITAMIN/MIN) TABS Take 1 tablet by mouth daily.   Yes [provider]  pantoprazole (PROTONIX) 40 MG tablet Take 1 tablet (40 mg total) by mouth daily. 07/04/21  Yes Sherlene Shams, MD  amoxicillin (AMOXIL) 500 MG tablet Take 500 mg by mouth 4 (four) times daily. Patient not taking: Reported on 07/15/2021 06/19/21   [provider]  ezetimibe (ZETIA) 10 MG tablet Take 1 tablet (10 mg total) by mouth daily. Patient not taking: Reported on 07/15/2021 06/12/21   Sherlene Shams, MD    Inpatient Medications: Scheduled Meds:  enoxaparin (LOVENOX) injection  0.5 mg/kg Subcutaneous Q24H   insulin aspart  0-15 Units Subcutaneous TID WC   insulin aspart  0-5 Units Subcutaneous QHS   losartan  100 mg Oral Daily   magnesium oxide  400 mg Oral BID   Continuous Infusions:  PRN Meds: acetaminophen **OR** acetaminophen, ondansetron **OR**  ondansetron (ZOFRAN) IV  Allergies:    Allergies  Allergen Reactions   Lisinopril Palpitations and Other (See Comments)    "Didn't set well" Increased heart rate, chest pain.    Social History:   Social History   Socioeconomic History   Marital status: Married    Spouse name: Not on file   Number of children: Not on file   Years of education: Not on file   Highest education level: Not on file  Occupational History   Occupation: CONSTRUCTION    Employer: SELF EMPLOYED    Comment: Full time  Tobacco Use   Smoking status: Former    Smokeless tobacco: Former    Types: Snuff  Substance and Sexual Activity   Alcohol use: Yes    Comment: Occasional   Drug use: No   Sexual activity: Yes  Other Topics Concern   Not on file  Social History Narrative   Married   Does not get regular exercise   Social Determinants of Health   Financial Resource Strain: Not on file  Food Insecurity: Not on file  Transportation Needs: Not on file  Physical Activity: Not on file  Stress: Not on file  Social Connections: Not on file  Intimate Partner Violence: Not on file    Family History:    Family History  Problem Relation Age of Onset   Breast cancer Mother    Diabetes Mother    Thyroid disease Mother    Hypertension Father    COPD Father    Colon cancer Maternal Grandmother 70     ROS:  Please see the history of present illness.   All other ROS reviewed and negative.     Physical Exam/Data:   Vitals:   07/15/21 0930 07/15/21 1000 07/15/21 1030 07/15/21 1100  BP: (!) 154/46 (!) 155/45 (!) 163/49 (!) 165/52  Pulse: (!) 42 (!) 42 (!) 42 (!) 44  Resp: 12 14 11 11   Temp:      TempSrc:      SpO2: 96% 96% 96% 96%  Weight:      Height:        Intake/Output Summary (Last 24 hours) at 07/15/2021 1202 Last data filed at 07/15/2021 0239 Gross per 24 hour  Intake --  Output 900 ml  Net -900 ml   Last 3 Weights 07/14/2021 11/02/2020 07/05/2020  Weight (lbs) 240 lb 243 lb 6.4 oz 241 lb 9.6 oz  Weight (kg) 108.863 kg 110.406 kg 109.589 kg     Body mass index is 34.44 kg/m.  General:  Well nourished, well developed, in no acute distress HEENT: normal Neck: no JVD Vascular: No carotid bruits; Distal pulses 2+ bilaterally Cardiac:  normal S1, S2; bradycardic, RR; no murmur  Lungs:  clear to auscultation bilaterally, no wheezing, rhonchi or rales  Abd: soft, nontender, no hepatomegaly  Ext: no edema Musculoskeletal:  No deformities, BUE and BLE strength normal and equal Skin: warm and dry  Neuro:  CNs 2-12 intact,  no focal abnormalities noted Psych:  Normal affect   EKG:  The EKG was personally reviewed and demonstrates:  SB, CHB 43bpm  vs third degree AV block, LBBB Telemetry:  Telemetry was personally reviewed and demonstrates:  intermittent CHB, bradycardic into the 30s at times, mainly 40s. PVCs, 3-4 beats NSVT  Relevant CV Studies:  Echo ordered  Laboratory Data:  High Sensitivity Troponin:   Recent Labs  Lab 07/14/21 2351 07/15/21 0204 07/15/21 0414 07/15/21 0626  TROPONINIHS 58* 85* 91* 73*  Chemistry Recent Labs  Lab 07/14/21 2351 07/15/21 0626  NA 134* 138  K 3.1* 3.3*  CL 98 103  CO2 26 26  GLUCOSE 132* 143*  BUN 22* 17  CREATININE 0.84 0.69  CALCIUM 9.5 9.3  MG 2.0 1.9  GFRNONAA >60 >60  ANIONGAP 10 9    No results for input(s): PROT, ALBUMIN, AST, ALT, ALKPHOS, BILITOT in the last 168 hours. Lipids No results for input(s): CHOL, TRIG, HDL, LABVLDL, LDLCALC, CHOLHDL in the last 168 hours.  Hematology Recent Labs  Lab 07/14/21 2351  WBC 11.9*  RBC 5.27  HGB 15.7  HCT 44.2  MCV 83.9  MCH 29.8  MCHC 35.5  RDW 13.0  PLT 274   Thyroid  Recent Labs  Lab 07/14/21 2351  TSH 4.692*  FREET4 0.76    BNPNo results for input(s): BNP, PROBNP in the last 168 hours.  DDimer No results for input(s): DDIMER in the last 168 hours.   Radiology/Studies:  DG Chest Portable 1 View  Result Date: 07/15/2021 CLINICAL DATA:  Chest pain and shortness of breath. EXAM: PORTABLE CHEST 1 VIEW COMPARISON:  Chest radiograph dated 08/29/2014. FINDINGS: No focal consolidation, pleural effusion, or pneumothorax. Borderline cardiomegaly. No acute osseous pathology the IMPRESSION: No active disease. Electronically Signed   By: Elgie Collard M.D.   On: 07/15/2021 01:15     Assessment and Plan:   Bradycardia High grade heart block/CHB - patient presented with bradycardia noted yesterday on watch. Felt some lightheadedness and dizziness. No significant chest pain or SOB. He  last had his BB Friday morning - EKG with rate 42bpm, PVCs, wide QRS (?new LBBB) CHB vs third degree block - Keep Mag>2 and K>4 - tele shows intermittent CHB with heart rates in the 40s - Hold BB, washout x 48 hours - continue on telemetry - will likely need heart monitor at discharge.  Elevated troponin - HS trop peak 91 and now down trending - no anginal symptoms reported - Suspect more demand ischemia in the setting of hypertensive urgency  Hypertensive urgency - BP severely elevated on admission - PTA losartan, metoprolol, chlorthalidone - Hold BB as above - BP better - likely needs further addition of meds for BP control  Hypokalemia - supplement K>4  Elevated TSH - TSH 4.6 - per IM  PVCs - was on BB>>hold for bradycardia/high grade block  For questions or updates, please contact [facility] Please consult [email or web address] for contact info under    Signed, Angelito Hopping David Stall, PA-C  07/15/2021 12:02 PM

## 2021-07-15 NOTE — ED Notes (Signed)
Per Dr. Arville Care plan of care to include PRN hydralazine for BP, continue cardiac monitoring, recheck electrolytes, and he will speak with cardiology.

## 2021-07-15 NOTE — ED Notes (Signed)
Patient resting in bed with wife at bedside. Denies chest pain. Reports occasional shortness of breath. Complete heart block on monitor 30-40's. Pacer pads on.

## 2021-07-15 NOTE — H&P (Addendum)
History and Physical    Arshdeep [employer]. FIE:[id number] DOB: 10-16-1960 DOA: 07/14/2021  PCP: Sherlene Shams, MD   Patient coming from: home  I have personally briefly reviewed patient's relevant medical records in [facility]  Chief Complaint: low heart rate  HPI: Alec Ray Comer Devins. is a 59 y.o. male with medical history significant for HTN and DM who presents to the ED with a concern for low heart rate, bradycardia in the 30s seen on his smart watch that coincided with a sensation of shortness of breath and dizziness.  States he had some heartburn after eating a barbecue lunch which is typical for him but he denies chest pressure or heaviness or chest pain and denies nausea, vomiting, diaphoresis or palpitations.  Patient has been on Toprol-XL 100 mg for the past 4 years and last took his medication at 8 AM on 07/13/2021.  He missed his dose on 07/14/2021 due to the low heart rate.  Patient otherwise denies complaints.  Has had no cough, fever or chills, Abdominal pain or diarrhea.  On presentation to the emergency room he was asymptomatic  ED course: Pulse 32, BP 217/52 with otherwise normal vitals Blood work with troponin 58, potassium 3.1, magnesium 2, TSH 4.692. WBC 11.3  EKG, personally viewed and interpreted: Third-degree heart block at 70 with atrial premature complexes and left BBB  Chest x-ray with no active disease  The ED provider spoke with cardiologist, Dr. Mariah Milling who advised no acute intervention and await for metoprolol washout.  Please refer to ED providers note  Hospitalist consulted for admission.   Review of Systems: As per HPI otherwise all other systems on review of systems negative.   Assessment/Plan    Complete heart block (HCC) -Mostly asymptomatic rates as low as mid 30s  - Cardiac monitoring.  Keep pacer paddles on - Correct any electrolyte abnormalities - Avoid beta-blockers or AV nodal blockers - Atropine not indicated at this time -  Cardiology consult    Elevated troponin - Troponin 58 suspect secondary to demand ischemia - EKG without acute ST-T wave changes on EKG - Continue to trend and start heparin infusion if significant delta  Multiple PVCs - Correct potassium to above 3 - Correct magnesium to above 2.4    Hypertensive urgency - BP 217/52 - Cautious reduction in blood pressure avoiding AV nodal blockers    Hypokalemia - Received oral potassium in the ED - Continue to monitor  Elevated TSH - Follow T4    DM (diabetes mellitus), type 2 (HCC) - Sliding scale insulin coverage   DVT prophylaxis: Lovenox  Code Status: full code  Family Communication:  wife at bedside Disposition Plan: Back to previous home environment Consults called: Cardiology Status: Observation    Physical Exam: Vitals:   07/14/21 2345 07/15/21 0000 07/15/21 0030 07/15/21 0100  BP: (!) 217/52 (!) 207/63 (!) 208/71 (!) 187/49  Pulse: (!) 32 (!) 43 (!) 41 (!) 40  Resp:  14 13 (!) 25  Temp: 98 F (36.7 C)     TempSrc: Oral     SpO2: 95% 99% 98% 98%  Weight:      Height:       Constitutional: Alert, oriented x 3 . Not in any apparent distress HEENT:      Head: Normocephalic and atraumatic.         Eyes: PERLA, EOMI, Conjunctivae are normal. Sclera is non-icteric.       Mouth/Throat: Mucous membranes are moist.  Neck: Supple with no signs of meningismus. Cardiovascular: Bradycardia. No murmurs, gallops, or rubs. 2+ symmetrical distal pulses are present . No JVD. No  LE edema Respiratory: Respiratory effort normal .Lungs sounds clear bilaterally. No wheezes, crackles, or rhonchi.  Gastrointestinal: Soft, non tender, non distended. Positive bowel sounds.  Genitourinary: No CVA tenderness. Musculoskeletal: Nontender with normal range of motion in all extremities. No cyanosis, or erythema of extremities. Neurologic:  Face is symmetric. Moving all extremities. No gross focal neurologic deficits . Skin: Skin is warm,  dry.  No rash or ulcers Psychiatric: Mood and affect are appropriate     Past Medical History:  Diagnosis Date   GERD (gastroesophageal reflux disease)    EGD normal 08/08/2011 Oh    Hypertension     Past Surgical History:  Procedure Laterality Date   INGUINAL HERNIA REPAIR  1987   bilateral   TONSILLECTOMY       reports that he has quit smoking. He has quit using smokeless tobacco.  His smokeless tobacco use included snuff. He reports current alcohol use. He reports that he does not use drugs.  Allergies  Allergen Reactions   Lisinopril     Family History  Problem Relation Age of Onset   Breast cancer Mother    Diabetes Mother    Thyroid disease Mother    Hypertension Father    COPD Father    Colon cancer Maternal Grandmother 75      Prior to Admission medications   Medication Sig Start Date End Date Taking? Authorizing Provider  chlorthalidone (HYGROTON) 25 MG tablet Take 1 tablet (25 mg total) by mouth daily. 07/04/21   Sherlene Shams, MD  ezetimibe (ZETIA) 10 MG tablet Take 1 tablet (10 mg total) by mouth daily. 06/12/21   Sherlene Shams, MD  ezetimibe (ZETIA) 10 MG tablet Take 1 tablet (10 mg total) by mouth daily. 07/04/21   Sherlene Shams, MD  losartan (COZAAR) 100 MG tablet Take 1 tablet (100 mg total) by mouth every evening. 04/06/21   Sherlene Shams, MD  metoprolol succinate (TOPROL-XL) 100 MG 24 hr tablet Take 1 tablet (100 mg total) by mouth daily. Take with or immediately following a meal. 07/04/21   Sherlene Shams, MD  Multiple Vitamins-Minerals (MENS 50+ MULTI VITAMIN/MIN) TABS Take 1 tablet by mouth in the morning and at bedtime.    [provider]  pantoprazole (PROTONIX) 40 MG tablet Take 1 tablet (40 mg total) by mouth daily. 07/04/21   Sherlene Shams, MD      Labs on Admission: I have personally reviewed following labs and imaging studies  CBC: Recent Labs  Lab 07/14/21 2351  WBC 11.9*  NEUTROABS 6.4  HGB 15.7  HCT 44.2  MCV  83.9  PLT 274   Basic Metabolic Panel: Recent Labs  Lab 07/14/21 2351  NA 134*  K 3.1*  CL 98  CO2 26  GLUCOSE 132*  BUN 22*  CREATININE 0.84  CALCIUM 9.5  MG 2.0   GFR: Estimated Creatinine Clearance: 119.9 mL/min (by C-G formula based on SCr of 0.84 mg/dL). Liver Function Tests: No results for input(s): AST, ALT, ALKPHOS, BILITOT, PROT, ALBUMIN in the last 168 hours. No results for input(s): LIPASE, AMYLASE in the last 168 hours. No results for input(s): AMMONIA in the last 168 hours. Coagulation Profile: No results for input(s): INR, PROTIME in the last 168 hours. Cardiac Enzymes: No results for input(s): CKTOTAL, CKMB, CKMBINDEX, TROPONINI in the last 168 hours. BNP (  last 3 results) No results for input(s): PROBNP in the last 8760 hours. HbA1C: No results for input(s): HGBA1C in the last 72 hours. CBG: No results for input(s): GLUCAP in the last 168 hours. Lipid Profile: No results for input(s): CHOL, HDL, LDLCALC, TRIG, CHOLHDL, LDLDIRECT in the last 72 hours. Thyroid Function Tests: Recent Labs    07/14/21 2351  TSH 4.692*   Anemia Panel: No results for input(s): VITAMINB12, FOLATE, FERRITIN, TIBC, IRON, RETICCTPCT in the last 72 hours. Urine analysis: No results found for: COLORURINE, APPEARANCEUR, LABSPEC, PHURINE, GLUCOSEU, HGBUR, BILIRUBINUR, KETONESUR, PROTEINUR, UROBILINOGEN, NITRITE, LEUKOCYTESUR  Radiological Exams on Admission: DG Chest Portable 1 View  Result Date: 07/15/2021 CLINICAL DATA:  Chest pain and shortness of breath. EXAM: PORTABLE CHEST 1 VIEW COMPARISON:  Chest radiograph dated 08/29/2014. FINDINGS: No focal consolidation, pleural effusion, or pneumothorax. Borderline cardiomegaly. No acute osseous pathology the IMPRESSION: No active disease. Electronically Signed   By: Elgie Collard M.D.   On: 07/15/2021 01:15       Andris Baumann MD [facility]   07/15/2021, 1:56 AM

## 2021-07-16 ENCOUNTER — Encounter: Payer: Self-pay | Admitting: Cardiovascular Disease

## 2021-07-16 ENCOUNTER — Encounter
Admission: EM | Disposition: A | Discharge status: Other Acute Inpatient Hospital | Payer: Self-pay | Source: Home / Self Care | Attending: Internal Medicine

## 2021-07-16 DIAGNOSIS — I200 Unstable angina: Secondary | ICD-10-CM

## 2021-07-16 DIAGNOSIS — I2510 Atherosclerotic heart disease of native coronary artery without angina pectoris: Secondary | ICD-10-CM

## 2021-07-16 DIAGNOSIS — R778 Other specified abnormalities of plasma proteins: Secondary | ICD-10-CM

## 2021-07-16 HISTORY — PX: LEFT HEART CATH AND CORONARY ANGIOGRAPHY: CATH118249

## 2021-07-16 LAB — BASIC METABOLIC PANEL
Anion gap: 7 (ref 5–15)
BUN: 13 mg/dL (ref 6–20)
CO2: 25 mmol/L (ref 22–32)
Calcium: 9 mg/dL (ref 8.9–10.3)
Chloride: 104 mmol/L (ref 98–111)
Creatinine, Ser: 0.75 mg/dL (ref 0.61–1.24)
GFR, Estimated: >60 mL/min (ref >60)
Glucose, Bld: 130 mg/dL — ABNORMAL HIGH (ref 70–99)
Potassium: 3.7 mmol/L (ref 3.5–5.1)
Sodium: 136 mmol/L (ref 135–145)

## 2021-07-16 LAB — CBC
HCT: 43.2 % (ref 39.0–52.0)
Hemoglobin: 15.1 g/dL (ref 13.0–17.0)
MCH: 29.5 pg (ref 26.0–34.0)
MCHC: 35 g/dL (ref 30.0–36.0)
MCV: 84.4 fL (ref 80.0–100.0)
Platelets: 237 10*3/uL (ref 150–400)
RBC: 5.12 MIL/uL (ref 4.22–5.81)
RDW: 13.1 % (ref 11.5–15.5)
WBC: 10.4 10*3/uL (ref 4.0–10.5)
nRBC: 0 % (ref 0.0–0.2)

## 2021-07-16 LAB — MAGNESIUM: Magnesium: 2 mg/dL (ref 1.7–2.4)

## 2021-07-16 LAB — GLUCOSE, CAPILLARY
Glucose-Capillary: 134 mg/dL — ABNORMAL HIGH (ref 70–99)
Glucose-Capillary: 113 mg/dL — ABNORMAL HIGH (ref 70–99)
Glucose-Capillary: 181 mg/dL — ABNORMAL HIGH (ref 70–99)
Glucose-Capillary: 169 mg/dL — ABNORMAL HIGH (ref 70–99)

## 2021-07-16 LAB — HEMOGLOBIN A1C
Hgb A1c MFr Bld: 6.4 % — ABNORMAL HIGH (ref 4.8–5.6)
Mean Plasma Glucose: 137 mg/dL

## 2021-07-16 LAB — MRSA NEXT GEN BY PCR, NASAL: MRSA by PCR Next Gen: NOT DETECTED

## 2021-07-16 SURGERY — LEFT HEART CATH AND CORONARY ANGIOGRAPHY
Anesthesia: Moderate Sedation

## 2021-07-16 MED ORDER — HEPARIN (PORCINE) IN NACL 1000-0.9 UT/500ML-% IV SOLN
INTRAVENOUS | Status: DC | PRN
  Administered 2021-07-16 (×2): 500 mL

## 2021-07-16 MED ORDER — SODIUM CHLORIDE 0.9 % IV SOLN: INTRAVENOUS | Status: AC

## 2021-07-16 MED ORDER — ACETAMINOPHEN 10 MG/ML IV SOLN
INTRAVENOUS | Status: AC
  Filled 2021-07-16: qty 100

## 2021-07-16 MED ORDER — SODIUM CHLORIDE 0.9% FLUSH: 3.0000 mL | INTRAVENOUS | Status: DC | PRN

## 2021-07-16 MED ORDER — LOSARTAN POTASSIUM 50 MG PO TABS: 100.0000 mg | ORAL_TABLET | Freq: Once | ORAL | Status: DC

## 2021-07-16 MED ORDER — HYDRALAZINE HCL 20 MG/ML IJ SOLN: 10.0000 mg | INTRAMUSCULAR | Status: DC | PRN

## 2021-07-16 MED ORDER — LIDOCAINE HCL (PF) 1 % IJ SOLN
INTRAMUSCULAR | Status: DC | PRN
  Administered 2021-07-16: 2 mL

## 2021-07-16 MED ORDER — ATROPINE SULFATE 1 MG/10ML IJ SOSY: 0.5000 mg | PREFILLED_SYRINGE | Freq: Once | INTRAMUSCULAR | Status: DC | PRN

## 2021-07-16 MED ORDER — HEPARIN (PORCINE) IN NACL 1000-0.9 UT/500ML-% IV SOLN
INTRAVENOUS | Status: AC
  Filled 2021-07-16: qty 1000

## 2021-07-16 MED ORDER — MAGNESIUM OXIDE -MG SUPPLEMENT 400 (240 MG) MG PO TABS
400.0000 mg | ORAL_TABLET | Freq: Two times a day (BID) | ORAL | Status: DC
Start: 2021-07-16 — End: 2021-10-24

## 2021-07-16 MED ORDER — MIDAZOLAM HCL 2 MG/2ML IJ SOLN
INTRAMUSCULAR | Status: DC | PRN
  Administered 2021-07-16: 1 mg via INTRAVENOUS

## 2021-07-16 MED ORDER — VERAPAMIL HCL 2.5 MG/ML IV SOLN
INTRAVENOUS | Status: DC | PRN
  Administered 2021-07-16: 2.5 mg via INTRA_ARTERIAL

## 2021-07-16 MED ORDER — FENTANYL CITRATE (PF) 100 MCG/2ML IJ SOLN
INTRAMUSCULAR | Status: AC
  Filled 2021-07-16: qty 2

## 2021-07-16 MED ORDER — SODIUM CHLORIDE 0.9 % IV SOLN: INTRAVENOUS | Status: DC

## 2021-07-16 MED ORDER — IOHEXOL 300 MG/ML  SOLN
INTRAMUSCULAR | Status: DC | PRN
  Administered 2021-07-16: 55 mL

## 2021-07-16 MED ORDER — SODIUM CHLORIDE 0.9 % IV SOLN: 250.0000 mL | INTRAVENOUS | Status: DC | PRN

## 2021-07-16 MED ORDER — VERAPAMIL HCL 2.5 MG/ML IV SOLN
INTRAVENOUS | Status: AC
  Filled 2021-07-16: qty 2

## 2021-07-16 MED ORDER — FENTANYL CITRATE (PF) 100 MCG/2ML IJ SOLN
INTRAMUSCULAR | Status: DC | PRN
  Administered 2021-07-16: 50 ug via INTRAVENOUS

## 2021-07-16 MED ORDER — ASPIRIN 81 MG PO CHEW
CHEWABLE_TABLET | ORAL | Status: AC
  Administered 2021-07-16: 81 mg via ORAL
  Filled 2021-07-16: qty 1

## 2021-07-16 MED ORDER — MIDAZOLAM HCL 2 MG/2ML IJ SOLN
INTRAMUSCULAR | Status: AC
  Filled 2021-07-16: qty 2

## 2021-07-16 MED ORDER — AMLODIPINE BESYLATE 5 MG PO TABS: 5.0000 mg | ORAL_TABLET | Freq: Every day | ORAL | Status: DC

## 2021-07-16 MED ORDER — SODIUM CHLORIDE 0.9% FLUSH
3.0000 mL | Freq: Two times a day (BID) | INTRAVENOUS | Status: DC
  Administered 2021-07-16 – 2021-07-17 (×3): 3 mL via INTRAVENOUS

## 2021-07-16 MED ORDER — ASPIRIN 81 MG PO CHEW: 81.0000 mg | CHEWABLE_TABLET | ORAL | Status: AC

## 2021-07-16 MED ORDER — SODIUM CHLORIDE 0.9 % IV BOLUS
250.0000 mL | Freq: Once | INTRAVENOUS | Status: AC
  Administered 2021-07-16: 250 mL via INTRAVENOUS

## 2021-07-16 MED ORDER — HEPARIN SODIUM (PORCINE) 1000 UNIT/ML IJ SOLN
INTRAMUSCULAR | Status: DC | PRN
  Administered 2021-07-16: 5000 [IU] via INTRAVENOUS

## 2021-07-16 MED ORDER — LIDOCAINE HCL 1 % IJ SOLN
INTRAMUSCULAR | Status: AC
  Filled 2021-07-16: qty 20

## 2021-07-16 MED ORDER — SODIUM CHLORIDE 0.9% FLUSH: 3.0000 mL | Freq: Two times a day (BID) | INTRAVENOUS | Status: DC

## 2021-07-16 MED ORDER — HEPARIN SODIUM (PORCINE) 1000 UNIT/ML IJ SOLN
INTRAMUSCULAR | Status: AC
  Filled 2021-07-16: qty 10

## 2021-07-16 MED ORDER — CHLORHEXIDINE GLUCONATE CLOTH 2 % EX PADS
6.0000 | MEDICATED_PAD | Freq: Every day | CUTANEOUS | Status: DC
  Administered 2021-07-16 – 2021-07-17 (×2): 6 via TOPICAL

## 2021-07-16 SURGICAL SUPPLY — 11 items
CATH INFINITI 5FR JK (CATHETERS) ×1 IMPLANT
CATH INFINITI JR4 5F (CATHETERS) ×1 IMPLANT
DEVICE RAD TR BAND REGULAR (VASCULAR PRODUCTS) ×1 IMPLANT
DRAPE BRACHIAL (DRAPES) ×1 IMPLANT
GLIDESHEATH SLEND SS 6F .021 (SHEATH) ×1 IMPLANT
GUIDEWIRE INQWIRE 1.5J.035X260 (WIRE) IMPLANT
INQWIRE 1.5J .035X260CM (WIRE) ×2
PACK CARDIAC CATH (CUSTOM PROCEDURE TRAY) ×2 IMPLANT
PROTECTION STATION PRESSURIZED (MISCELLANEOUS) ×2
SET ATX SIMPLICITY (MISCELLANEOUS) ×1 IMPLANT
STATION PROTECTION PRESSURIZED (MISCELLANEOUS) IMPLANT

## 2021-07-16 NOTE — Progress Notes (Signed)
Progress Note  Patient Name: Alec Ali. Date of Encounter: 07/16/2021  [facility] Cardiologist: New ( Dr. Mariah Milling)  Subjective   He continues to have intermittent third-degree AV block with frequent PVCs and short runs of idioventricular rhythm.  He complains of burning sensation in the chest which started Friday after a spicy meal.  In addition, he does have shortness of breath.  Last dose of metoprolol was Friday morning.  He felt very dizzy when he stood to the bedside commode  Inpatient Medications    Scheduled Meds:  amLODipine  5 mg Oral Daily   enoxaparin (LOVENOX) injection  0.5 mg/kg Subcutaneous Q24H   insulin aspart  0-15 Units Subcutaneous TID WC   insulin aspart  0-5 Units Subcutaneous QHS   losartan  100 mg Oral Daily   magnesium oxide  400 mg Oral BID   pantoprazole  40 mg Oral Daily   Continuous Infusions:  sodium chloride 100 mL/hr at 07/16/21 0127   PRN Meds: acetaminophen **OR** acetaminophen, atropine, famotidine, hydrALAZINE, ondansetron **OR** ondansetron (ZOFRAN) IV   Vital Signs    Vitals:   07/16/21 0530 07/16/21 0600 07/16/21 0630 07/16/21 0700  BP: (!) 126/49 (!) 120/47 (!) 142/50 (!) 183/45  Pulse: (!) 43 (!) 42 (!) 36   Resp: 15 20 13 13   Temp:      TempSrc:      SpO2: 95% 98% 99% 95%  Weight:      Height:       No intake or output data in the 24 hours ending 07/16/21 0823 Last 3 Weights 07/14/2021 11/02/2020 07/05/2020  Weight (lbs) 240 lb 243 lb 6.4 oz 241 lb 9.6 oz  Weight (kg) 108.863 kg 110.406 kg 109.589 kg      Telemetry    Third-degree AV block with ventricular escape rhythm, frequent PVCs and runs of idioventricular rhythm- Personally Reviewed  ECG     - Personally Reviewed  Physical Exam   GEN: No acute distress.   Neck: No JVD Cardiac: Bradycardic with premature beats, no murmurs, rubs, or gallops.  Respiratory: Clear to auscultation bilaterally. GI: Soft, nontender, non-distended  MS: No edema; No  deformity. Neuro:  Nonfocal  Psych: Normal affect   Labs    High Sensitivity Troponin:   Recent Labs  Lab 07/14/21 2351 07/15/21 0204 07/15/21 0414 07/15/21 0626  TROPONINIHS 58* 85* 91* 73*     Chemistry Recent Labs  Lab 07/14/21 2351 07/15/21 0626 07/15/21 2110 07/16/21 0447  NA 134* 138  --  136  K 3.1* 3.3* 3.2* 3.7  CL 98 103  --  104  CO2 26 26  --  25  GLUCOSE 132* 143*  --  130*  BUN 22* 17  --  13  CREATININE 0.84 0.69  --  0.75  CALCIUM 9.5 9.3  --  9.0  MG 2.0 1.9 2.0 2.0  GFRNONAA >60 >60  --  >60  ANIONGAP 10 9  --  7    Lipids No results for input(s): CHOL, TRIG, HDL, LABVLDL, LDLCALC, CHOLHDL in the last 168 hours.  Hematology Recent Labs  Lab 07/14/21 2351 07/16/21 0447  WBC 11.9* 10.4  RBC 5.27 5.12  HGB 15.7 15.1  HCT 44.2 43.2  MCV 83.9 84.4  MCH 29.8 29.5  MCHC 35.5 35.0  RDW 13.0 13.1  PLT 274 237   Thyroid  Recent Labs  Lab 07/14/21 2351  TSH 4.692*  FREET4 0.76    BNPNo results for input(s): BNP,  PROBNP in the last 168 hours.  DDimer No results for input(s): DDIMER in the last 168 hours.   Radiology    DG Chest Portable 1 View  Result Date: 07/15/2021 CLINICAL DATA:  Chest pain and shortness of breath. EXAM: PORTABLE CHEST 1 VIEW COMPARISON:  Chest radiograph dated 08/29/2014. FINDINGS: No focal consolidation, pleural effusion, or pneumothorax. Borderline cardiomegaly. No acute osseous pathology the IMPRESSION: No active disease. Electronically Signed   By: Elgie Collard M.D.   On: 07/15/2021 01:15    Cardiac Studies   Echocardiogram is pending  Patient Profile     60 y.o. male with history of diabetes type 2, hypertension, hyperlipidemia who presented on Friday with bradycardia, dizziness and chest burning sensation was found to be in complete heart block.  Troponin was mildly elevated.    Assessment & Plan    1.  Complete heart block: This is still persistent.  The patient has been on metoprolol for many years  before this happened and thus unlikely to be the cause of complete heart block.  No other significant metabolic abnormalities to explain this.  Most recent EKG available on him before current presentation was in 2015 and that overall was unremarkable. I am concerned about a possible ischemic etiology for his complete heart block given persistent burning sensation in the chest and mildly elevated troponin.  In addition, he has multiple risk factors for ischemic heart disease.  Due to this, I recommend proceeding with left heart catheterization and possible PCI to be done today.  I discussed the procedure in details as well as risk and benefits. If an ischemic etiology is not found, will need to transfer the patient to [facility] for a pacemaker implantation and consideration of cardiac MRI before that if echocardiogram is nonrevealing.  2.  Unstable angina: Mildly elevated troponin and persistent burning sensation in his chest concerning for an ischemic etiology especially with his risk factors.  Left heart catheterization to be done today as discussed above.  3. Essential hypertension: Metoprolol is on hold since Friday.  Continue losartan and amlodipine.  Total encounter time more than 35 minutes. Greater than 50% was spent in counseling and coordination of care with the patient.      For questions or updates, please contact [facility] Please consult [email or web address] for contact info under        Signed, Lorine Bears, MD  07/16/2021, 8:23 AM

## 2021-07-16 NOTE — ED Notes (Signed)
Mancy, MD messaged regarding pt being orthostatic, HR range of 31-50, and inconsistent rhythms - occasional runs of vtach. Standing BP 107/54 after using bedside commode. Awaiting further orders.

## 2021-07-16 NOTE — ED Notes (Signed)
Admitting MD messaged regarding patient repeated EKG changes, pt becoming symptomatic with noted runs of bigeminy corresponding with runs of bigeminy. Pt also insistent about getting up to use bedside commode for BM, pt noted to become orthostatic with standing, admitting MD made aware. Repeat EKG's placed in shadow chart and exported to chart for Admitting MD and cardiologist review. Recommendations from Dr. Arville Care at this time for fluid bolus and 135mL/hr NS IVF and to relay to day shift RN to relay to day shift MD and cardiologist changes overnight. No further orders received at this time. fluid bolus initiated. Pt remains on Zoll and cardiac monitoring at this time, call bell within reach of patient, pt's wife remains at bedside.

## 2021-07-16 NOTE — Discharge Summary (Signed)
Physician Discharge Summary  Alec Ali. KKX:[id number] DOB: [birth year] DOA: 07/14/2021  PCP: Sherlene Shams, MD  Admit date: 07/14/2021 Discharge date: 07/16/2021  Admitted From: Home  Discharge disposition: Transfer to [facility] for further care   Recommendations for Outpatient Follow-Up:   Transfer to [facility] for further cardiac care  Discharge Diagnosis:   Principal Problem:   Complete heart block (HCC) Active Problems:   DM (diabetes mellitus), type 2 (HCC)   Elevated troponin   Hypertensive urgency   Hypokalemia   Frequent PVCs   Discharge Condition: Improved.  Diet recommendation: Low sodium, heart healthy.  Carbohydrate-modified.    Wound care: None.  Code status: Full.   History of Present Illness:   Alec Ali. is a 60 y.o. male with past medical history of hypertension and diabetes mellitus presented to hospital with low heart rate and subjective sensation of shortness of breath and dizziness/chest discomfort.  Patient has been on Toprol-XL for blood pressure for 4 years.  Patient otherwise denied other symptoms.  In the ED, his initial heart rate was 32 with blood pressure elevated at 217/52.  Troponin was 58 potassium of 3.1 with a TSH of 4.6.  WBC was elevated at 11.3.  EKG showed third-degree heart block with a left bundle branch block.  Chest x-ray without any active disease.  With these findings, patient was admitted hospital for further evaluation by cardiology.    Hospital Course:   Following conditions were addressed during hospitalization as listed below,  Complete heart block  Cardiology on board.  Off beta-blockers and nodal blockers.  Patient underwent cardiac catheterization on 07/16/2021 with findings of mild to moderate coronary artery disease.  Patient might need cardiac MRI and pacemaker placement at a tertiary care center.  Plan is to transfer to [facility] today.   elevated  troponin Thought to be secondary to demand mismatch.  EKG without ST or T wave changes.  Does have EKG changes with a left bundle branch block.  Underwent cardiac catheterization with mild to moderate coronary artery disease.    Multiple PVCs Plan is to replenish potassium and electrolytes as necessary.   Retrosternal discomfort/GERD.  Continue Pepcid and pantoprazole from home.     Hypertensive urgency Off beta-blockers at this time.  Patient has been started on amlodipine and losartan from home.  Chlorthalidone on hold due to electrolyte issues     Hypokalemia Improved after replacement.   Elevated TSH Free T4 was 0.7 within normal range.     Diabetes mellitus type 2  continue sliding-scale insulin, Accu-Cheks, diabetic diet.   Disposition.  At this time, patient is stable for disposition to [facility].  Medical Consultants:   Cardiology  Procedures:    Cardiac catheterization on 07/16/2021 Subjective:   Today, patient was seen and examined at bedside.  Denies any chest pain dizziness lightheadedness at the time of my evaluation.  Seen after cardiac cath.  Discharge Exam:   Vitals:   07/16/21 1350 07/16/21 1400  BP:  132/60  Pulse: 61 (!) 53  Resp: 12 16  Temp:    SpO2: 95% 95%   Vitals:   07/16/21 1255 07/16/21 1333 07/16/21 1350 07/16/21 1400  BP:    132/60  Pulse:  (!) 54 61 (!) 53  Resp:   12 16  Temp:      TempSrc:      SpO2: 98% 95% 95% 95%  Weight:  Height:       Body mass index is 33.47 kg/m.   General: Alert awake, not in obvious distress, obese. HENT: pupils equally reacting to light,  No scleral pallor or icterus noted. Oral mucosa is moist.  Chest:  Clear breath sounds.  Diminished breath sounds bilaterally. No crackles or wheezes.  CVS: S1 &S2 heard. No murmur.  Regular rate and rhythm. Abdomen: Soft, nontender, nondistended.  Bowel sounds are heard.   Extremities: No cyanosis, clubbing or edema.  Peripheral pulses are  palpable.  Right wrist with bandage from cardiac catheterization. Psych: Alert, awake and oriented, normal mood CNS:  No cranial nerve deficits.  Power equal in all extremities.   Skin: Warm and dry.  No rashes noted.  The results of significant diagnostics from this hospitalization (including imaging, microbiology, ancillary and laboratory) are listed below for reference.     Diagnostic Studies:   DG Chest Portable 1 View  Result Date: 07/15/2021 CLINICAL DATA:  Chest pain and shortness of breath. EXAM: PORTABLE CHEST 1 VIEW COMPARISON:  Chest radiograph dated 08/29/2014. FINDINGS: No focal consolidation, pleural effusion, or pneumothorax. Borderline cardiomegaly. No acute osseous pathology the IMPRESSION: No active disease. Electronically Signed   By: Elgie Collard M.D.   On: 07/15/2021 01:15     Labs:   Basic Metabolic Panel: Recent Labs  Lab 07/14/21 2351 07/15/21 0626 07/15/21 2110 07/16/21 0447  NA 134* 138  --  136  K 3.1* 3.3* 3.2* 3.7  CL 98 103  --  104  CO2 26 26  --  25  GLUCOSE 132* 143*  --  130*  BUN 22* 17  --  13  CREATININE 0.84 0.69  --  0.75  CALCIUM 9.5 9.3  --  9.0  MG 2.0 1.9 2.0 2.0   GFR Estimated Creatinine Clearance: 127.8 mL/min (by C-G formula based on SCr of 0.75 mg/dL). Liver Function Tests: No results for input(s): AST, ALT, ALKPHOS, BILITOT, PROT, ALBUMIN in the last 168 hours. No results for input(s): LIPASE, AMYLASE in the last 168 hours. No results for input(s): AMMONIA in the last 168 hours. Coagulation profile No results for input(s): INR, PROTIME in the last 168 hours.  CBC: Recent Labs  Lab 07/14/21 2351 07/16/21 0447  WBC 11.9* 10.4  NEUTROABS 6.4  --   HGB 15.7 15.1  HCT 44.2 43.2  MCV 83.9 84.4  PLT 274 237   Cardiac Enzymes: No results for input(s): CKTOTAL, CKMB, CKMBINDEX, TROPONINI in the last 168 hours. BNP: Invalid input(s): POCBNP CBG: Recent Labs  Lab 07/15/21 0801 07/15/21 1143 07/16/21 1113  07/16/21 1345  GLUCAP 147* 123* 134* 113*   D-Dimer No results for input(s): DDIMER in the last 72 hours. Hgb A1c Recent Labs    07/14/21 2351  HGBA1C 6.4*   Lipid Profile No results for input(s): CHOL, HDL, LDLCALC, TRIG, CHOLHDL, LDLDIRECT in the last 72 hours. Thyroid function studies Recent Labs    07/14/21 2351  TSH 4.692*   Anemia work up No results for input(s): VITAMINB12, FOLATE, FERRITIN, TIBC, IRON, RETICCTPCT in the last 72 hours. Microbiology Recent Results (from the past 240 hour(s))  Resp Panel by RT-PCR (Flu A&B, Covid) Nasopharyngeal Swab     Status: None   Collection Time: 07/15/21  1:23 AM   Specimen: Nasopharyngeal Swab; Nasopharyngeal(NP) swabs in vial transport medium  Result Value Ref Range Status   SARS Coronavirus 2 by RT PCR NEGATIVE NEGATIVE Final    Comment: (NOTE) SARS-CoV-2 target nucleic acids are  NOT DETECTED.  The SARS-CoV-2 RNA is generally detectable in upper respiratory specimens during the acute phase of infection. The lowest concentration of SARS-CoV-2 viral copies this assay can detect is 138 copies/mL. A negative result does not preclude SARS-Cov-2 infection and should not be used as the sole basis for treatment or other patient management decisions. A negative result may occur with  improper specimen collection/handling, submission of specimen other than nasopharyngeal swab, presence of viral mutation(s) within the areas targeted by this assay, and inadequate number of viral copies(<138 copies/mL). A negative result must be combined with clinical observations, patient history, and epidemiological information. The expected result is Negative.  Fact Sheet for Patients:  BloggerCourse.com  Fact Sheet for Healthcare Providers:  SeriousBroker.it  This test is no t yet approved or cleared by the [place] FDA and  has been authorized for detection and/or diagnosis of SARS-CoV-2  by FDA under an Emergency Use Authorization (EUA). This EUA will remain  in effect (meaning this test can be used) for the duration of the COVID-19 declaration under Section 564(b)(1) of the Act, 21 U.S.C.section 360bbb-3(b)(1), unless the authorization is terminated  or revoked sooner.       Influenza A by PCR NEGATIVE NEGATIVE Final   Influenza B by PCR NEGATIVE NEGATIVE Final    Comment: (NOTE) The Xpert Xpress SARS-CoV-2/FLU/RSV plus assay is intended as an aid in the diagnosis of influenza from Nasopharyngeal swab specimens and should not be used as a sole basis for treatment. Nasal washings and aspirates are unacceptable for Xpert Xpress SARS-CoV-2/FLU/RSV testing.  Fact Sheet for Patients: BloggerCourse.com  Fact Sheet for Healthcare Providers: SeriousBroker.it  This test is not yet approved or cleared by the [place] FDA and has been authorized for detection and/or diagnosis of SARS-CoV-2 by FDA under an Emergency Use Authorization (EUA). This EUA will remain in effect (meaning this test can be used) for the duration of the COVID-19 declaration under Section 564(b)(1) of the Act, 21 U.S.C. section 360bbb-3(b)(1), unless the authorization is terminated or revoked.  Performed at [facility], [address] [place], [place] [address]      Discharge Instructions:   Discharge Instructions     Diet - low sodium heart healthy   Complete by: As directed    Discharge instructions   Complete by: As directed    Further plan as per cardiology.   Increase activity slowly   Complete by: As directed       Allergies as of 07/16/2021       Reactions   Lisinopril Palpitations, Other (See Comments)   "Didn't set well" Increased heart rate, chest pain.        Medication List     STOP taking these medications    amoxicillin 500 MG tablet Commonly known as: AMOXIL   chlorthalidone 25 MG  tablet Commonly known as: HYGROTON   ibuprofen 200 MG tablet Commonly known as: ADVIL   metoprolol succinate 100 MG 24 hr tablet Commonly known as: TOPROL-XL       TAKE these medications    amLODipine 5 MG tablet Commonly known as: NORVASC Take 1 tablet (5 mg total) by mouth daily.   ezetimibe 10 MG tablet Commonly known as: ZETIA Take 1 tablet (10 mg total) by mouth daily. What changed: Another medication with the same name was removed. Continue taking this medication, and follow the directions you see here.   losartan 100 MG tablet Commonly known as: COZAAR Take 1 tablet (100 mg total)  by mouth every evening.   magnesium oxide 400 (240 Mg) MG tablet Commonly known as: MAG-OX Take 1 tablet (400 mg total) by mouth 2 (two) times daily.   Mens 50+ Multi Vitamin/Min Tabs Take 1 tablet by mouth daily.   pantoprazole 40 MG tablet Commonly known as: PROTONIX Take 1 tablet (40 mg total) by mouth daily.   Pepcid AC Maximum Strength 20 MG tablet Generic drug: famotidine Take 20 mg by mouth daily as needed for heartburn or indigestion.         Time coordinating discharge: 39 minutes  Signed:  Franny Selvage  [facility] 07/16/2021, 3:16 PM

## 2021-07-16 NOTE — H&P (Signed)
History & Physical    Patient ID: Alec Ali. MRN: [id number], DOB/AGE: 58-Aug-1962   Admit date: 07/16/2021    Primary Physician: Sherlene Shams, MD Primary Cardiologist: Julien Nordmann, MD  Patient Profile    60 year old male with a history of type 2 diabetes mellitus, hypertension, hyperlipidemia, and GERD, who is being transferred to [facility] for further electrophysiological evaluation in the setting of complete heart block.  Past Medical History    Past Medical History:  Diagnosis Date   GERD (gastroesophageal reflux disease)    EGD normal 08/08/2011 Oh    Hyperlipidemia    Hypertension    Nonobstructive CAD (coronary artery disease)    a. 07/2021 Cath: LM nl, LAD 30p/m, LCX mild diff dzs, OM1 50, RCA min irregs, RPDA/RPAV min irregs. EF 55-65%.    Past Surgical History:  Procedure Laterality Date   INGUINAL HERNIA REPAIR  1987   bilateral   LEFT HEART CATH AND CORONARY ANGIOGRAPHY N/A 07/16/2021   Procedure: LEFT HEART CATH AND CORONARY ANGIOGRAPHY;  Surgeon: Iran Ouch, MD;  Location: [facility] INVASIVE CV LAB;  Service: Cardiovascular;  Laterality: N/A;   TONSILLECTOMY       Allergies  Allergies  Allergen Reactions   Lisinopril Palpitations and Other (See Comments)    "Didn't set well" Increased heart rate, chest pain.    History of Present Illness    60 year old male with a history of type 2 diabetes mellitus, hypertension, hyperlipidemia, and GERD.  He was in his usual state of health until Friday, December 9, when he began to experience lightheadedness and noted his heart rate to be in the 40s on his watch.  He was on a beta-blocker as an outpatient and did not take it on Saturday, December 10.  Due to persistence of low heart rate and lightheadedness, he presented to the emergency department on the evening of December 10 where he was found to be in complete heart block with PVCs and rates dipping into the 30s.  Admission labs notable for  hypokalemia with a potassium of 3.1.  His magnesium was normal at 2.0.  High-sensitivity troponin was mildly elevated at 58 and subsequently rose to 91 before turning back down to 73 on the morning of December 11.  White count was mildly elevated 11.9 admission.  Respiratory panel was grossly negative.  Despite holding beta-blocker, patient continued to have complete heart block with PVCs and underwent diagnostic catheterization this morning which revealed minimal nonobstructive CAD and normal LV function with an EF of 55 to 65%.  After discussion with our electrophysiology partners at [facility], patient will be transferred for further evaluation.  Current Hospital Medications    Scheduled Meds:  amLODipine  5 mg Oral Daily   enoxaparin (LOVENOX) injection  0.5 mg/kg Subcutaneous Q24H   insulin aspart  0-15 Units Subcutaneous TID WC   insulin aspart  0-5 Units Subcutaneous QHS   losartan  100 mg Oral Daily   magnesium oxide  400 mg Oral BID   pantoprazole  40 mg Oral Daily    Continuous Infusions:  sodium chloride 100 mL/hr at 07/16/21 0127    PRN Meds: acetaminophen **OR** acetaminophen, atropine, famotidine, hydrALAZINE, ondansetron **OR** ondansetron (ZOFRAN) IV   Family History    Family History  Problem Relation Age of Onset   Breast cancer Mother    Diabetes Mother    Thyroid disease Mother    Hypertension Father    COPD Father    Colon cancer  Maternal Grandmother 31   He indicated that the status of his mother is unknown. He indicated that the status of his father is unknown. He indicated that his maternal grandmother is deceased.   Social History    Social History   Socioeconomic History   Marital status: Married    Spouse name: Not on file   Number of children: Not on file   Years of education: Not on file   Highest education level: Not on file  Occupational History   Occupation: CONSTRUCTION    Employer: SELF EMPLOYED    Comment: Full time  Tobacco Use    Smoking status: Former   Smokeless tobacco: Former    Types: Snuff  Substance and Sexual Activity   Alcohol use: Yes    Comment: Occasional   Drug use: No   Sexual activity: Yes  Other Topics Concern   Not on file  Social History Narrative   Married   Does not get regular exercise   Social Determinants of Health   Financial Resource Strain: Not on file  Food Insecurity: Not on file  Transportation Needs: Not on file  Physical Activity: Not on file  Stress: Not on file  Social Connections: Not on file  Intimate Partner Violence: Not on file     Review of Systems    General:  No chills, fever, night sweats or weight changes.  Cardiovascular:  +++ Lightheadedness, no chest pain, dyspnea on exertion, edema, orthopnea, palpitations, paroxysmal nocturnal dyspnea. Dermatological: No rash, lesions/masses Respiratory: No cough, dyspnea Urologic: No hematuria, dysuria Abdominal:   No nausea, vomiting, diarrhea, bright red blood per rectum, melena, or hematemesis Neurologic:  No visual changes, wkns, changes in mental status. All other systems reviewed and are otherwise negative except as noted above.  Physical Exam    98.2, 45, 18, 138/55 General: Pleasant, NAD Psych: Normal affect. Neuro: Alert and oriented X 3. Moves all extremities spontaneously. HEENT: Normal  Neck: Supple without bruits or JVD. Lungs:  Resp regular and unlabored, CTA. Heart: RRR no s3, s4, or murmurs. Abdomen: Soft, non-tender, non-distended, BS + x 4.  Extremities: No clubbing, cyanosis or edema. DP/PT 2+, Radials 2+ and equal bilaterally.  Labs    Cardiac Enzymes Recent Labs  Lab 07/14/21 2351 07/15/21 0204 07/15/21 0414 07/15/21 0626  TROPONINIHS 58* 85* 91* 73*      Lab Results  Component Value Date   WBC 10.4 07/16/2021   HGB 15.1 07/16/2021   HCT 43.2 07/16/2021   MCV 84.4 07/16/2021   PLT 237 07/16/2021    Recent Labs  Lab 07/16/21 0447  NA 136  K 3.7  CL 104  CO2 25  BUN  13  CREATININE 0.75  CALCIUM 9.0  GLUCOSE 130*   Lab Results  Component Value Date   CHOL 172 07/05/2020   HDL 36.90 (L) 07/05/2020   LDLCALC 120 (H) 01/09/2015   TRIG 256.0 (H) 07/05/2020     Radiology Studies    CARDIAC CATHETERIZATION  Result Date: 07/16/2021   1st Mrg lesion is 50% stenosed.   Prox LAD to Mid LAD lesion is 30% stenosed.   The left ventricular systolic function is normal.   LV end diastolic pressure is mildly elevated.   The left ventricular ejection fraction is 55-65% by visual estimate. 1.  Mild to moderate nonobstructive coronary artery disease. 2.  Normal LV systolic function mildly elevated left ventricular end-diastolic pressure. Recommendations: No culprit is identified for elevated troponin and complete heart block.  Will discuss with the EP regarding the need for a permanent pacemaker.   DG Chest Portable 1 View  Result Date: 07/15/2021 CLINICAL DATA:  Chest pain and shortness of breath. EXAM: PORTABLE CHEST 1 VIEW COMPARISON:  Chest radiograph dated 08/29/2014. FINDINGS: No focal consolidation, pleural effusion, or pneumothorax. Borderline cardiomegaly. No acute osseous pathology the IMPRESSION: No active disease. Electronically Signed   By: Anner Crete M.D.   On: 07/15/2021 01:15    ECG & Cardiac Imaging    Sinus rhythm with complete heart block, 43, left bundle branch block-personally reviewed Echocardiogram pending.  Assessment & Plan    1.  Complete heart block: Patient with symptomatic bradycardia beginning December 9 and finding of complete heart block upon arrival to the emergency department.  This is been persistent despite holding home dose of beta-blocker (last dose December 9).  He underwent diagnostic catheterization today which did not show any significant coronary artery disease and normal LV function.  An echocardiogram is pending.  Plan to transfer to [facility] for electrophysiological evaluation, cardiac MRI, and permanent pacemaker  implantation.  Continue to avoid AV nodal blocking agents.  2.  Demand ischemia: Mild troponin elevation in the setting of #1.  Mild, nonobstructive CAD on diagnostic catheterization.  Normal LV function by ventriculogram.  Will add statin.  3.  Essential HTN:  ? blocker on hold since 12/9.  Currently on amlodipine and losartan.  4.  HL:  Adding statin.  5.  DMII:  Diet-controlled.  SSI.  Signed, Murray Hodgkins, NP 07/16/2021, 5:10 PM

## 2021-07-17 ENCOUNTER — Inpatient Hospital Stay (HOSPITAL_COMMUNITY)
Admit: 2021-07-17 | Discharge: 2021-07-17 | Disposition: A | Discharge status: Home / Self Care | Payer: PRIVATE HEALTH INSURANCE | Attending: Cardiovascular Disease | Admitting: Cardiovascular Disease

## 2021-07-17 ENCOUNTER — Inpatient Hospital Stay (HOSPITAL_COMMUNITY)
Admission: AD | Admit: 2021-07-17 | Discharge: 2021-07-20 | DRG: 243 | Disposition: A | Discharge status: Home / Self Care | Payer: PRIVATE HEALTH INSURANCE | Source: Other Acute Inpatient Hospital | Attending: Cardiology | Admitting: Cardiology

## 2021-07-17 DIAGNOSIS — I514 Myocarditis, unspecified: Secondary | ICD-10-CM | POA: Diagnosis present

## 2021-07-17 DIAGNOSIS — R778 Other specified abnormalities of plasma proteins: Secondary | ICD-10-CM | POA: Diagnosis not present

## 2021-07-17 DIAGNOSIS — Z8349 Family history of other endocrine, nutritional and metabolic diseases: Secondary | ICD-10-CM

## 2021-07-17 DIAGNOSIS — Z825 Family history of asthma and other chronic lower respiratory diseases: Secondary | ICD-10-CM

## 2021-07-17 DIAGNOSIS — Z79899 Other long term (current) drug therapy: Secondary | ICD-10-CM | POA: Diagnosis not present

## 2021-07-17 DIAGNOSIS — E119 Type 2 diabetes mellitus without complications: Secondary | ICD-10-CM | POA: Diagnosis present

## 2021-07-17 DIAGNOSIS — E785 Hyperlipidemia, unspecified: Secondary | ICD-10-CM | POA: Diagnosis present

## 2021-07-17 DIAGNOSIS — K760 Fatty (change of) liver, not elsewhere classified: Secondary | ICD-10-CM | POA: Diagnosis present

## 2021-07-17 DIAGNOSIS — Z20822 Contact with and (suspected) exposure to covid-19: Secondary | ICD-10-CM | POA: Diagnosis present

## 2021-07-17 DIAGNOSIS — Z8249 Family history of ischemic heart disease and other diseases of the circulatory system: Secondary | ICD-10-CM | POA: Diagnosis not present

## 2021-07-17 DIAGNOSIS — I160 Hypertensive urgency: Secondary | ICD-10-CM | POA: Diagnosis present

## 2021-07-17 DIAGNOSIS — I10 Essential (primary) hypertension: Secondary | ICD-10-CM | POA: Diagnosis present

## 2021-07-17 DIAGNOSIS — I2510 Atherosclerotic heart disease of native coronary artery without angina pectoris: Secondary | ICD-10-CM | POA: Diagnosis present

## 2021-07-17 DIAGNOSIS — Z23 Encounter for immunization: Secondary | ICD-10-CM | POA: Diagnosis not present

## 2021-07-17 DIAGNOSIS — Z833 Family history of diabetes mellitus: Secondary | ICD-10-CM | POA: Diagnosis not present

## 2021-07-17 DIAGNOSIS — I248 Other forms of acute ischemic heart disease: Secondary | ICD-10-CM | POA: Diagnosis present

## 2021-07-17 DIAGNOSIS — Z800 Family history of malignant neoplasm of digestive organs: Secondary | ICD-10-CM

## 2021-07-17 DIAGNOSIS — Z888 Allergy status to other drugs, medicaments and biological substances status: Secondary | ICD-10-CM

## 2021-07-17 DIAGNOSIS — E876 Hypokalemia: Secondary | ICD-10-CM | POA: Diagnosis present

## 2021-07-17 DIAGNOSIS — K219 Gastro-esophageal reflux disease without esophagitis: Secondary | ICD-10-CM | POA: Diagnosis present

## 2021-07-17 DIAGNOSIS — I442 Atrioventricular block, complete: Secondary | ICD-10-CM | POA: Diagnosis present

## 2021-07-17 DIAGNOSIS — I493 Ventricular premature depolarization: Secondary | ICD-10-CM | POA: Diagnosis present

## 2021-07-17 DIAGNOSIS — Z803 Family history of malignant neoplasm of breast: Secondary | ICD-10-CM

## 2021-07-17 DIAGNOSIS — Z950 Presence of cardiac pacemaker: Secondary | ICD-10-CM

## 2021-07-17 HISTORY — DX: Atherosclerotic heart disease of native coronary artery without angina pectoris: I25.10

## 2021-07-17 HISTORY — DX: Hyperlipidemia, unspecified: E78.5

## 2021-07-17 LAB — ECHOCARDIOGRAM COMPLETE
AR max vel: 2.68 cm2
AV Area VTI: 2.94 cm2
AV Area mean vel: 2.94 cm2
AV Mean grad: 5 mmHg
AV Peak grad: 10.5 mmHg
Ao pk vel: 1.62 m/s
Area-P 1/2: 3.06 cm2
Height: 71 in
MV VTI: 4.13 cm2
S' Lateral: 3.19 cm
Weight: 3840 oz

## 2021-07-17 LAB — GLUCOSE, CAPILLARY
Glucose-Capillary: 153 mg/dL — ABNORMAL HIGH (ref 70–99)
Glucose-Capillary: 131 mg/dL — ABNORMAL HIGH (ref 70–99)
Glucose-Capillary: 135 mg/dL — ABNORMAL HIGH (ref 70–99)
Glucose-Capillary: 153 mg/dL — ABNORMAL HIGH (ref 70–99)

## 2021-07-17 MED ORDER — PERFLUTREN LIPID MICROSPHERE
1.0000 mL | INTRAVENOUS | Status: AC | PRN
Start: 2021-07-17 — End: 2021-07-17
  Administered 2021-07-17: 3 mL via INTRAVENOUS
  Filled 2021-07-17: qty 10

## 2021-07-17 MED ORDER — NITROGLYCERIN IN D5W 200-5 MCG/ML-% IV SOLN
0.0000 ug/min | INTRAVENOUS | Status: DC
  Administered 2021-07-17: 50 ug/min via INTRAVENOUS
  Administered 2021-07-17: 5 ug/min via INTRAVENOUS
  Filled 2021-07-17: qty 250

## 2021-07-17 MED ORDER — ALUM & MAG HYDROXIDE-SIMETH 200-200-20 MG/5ML PO SUSP
30.0000 mL | Freq: Once | ORAL | Status: AC
  Administered 2021-07-17: 30 mL via ORAL
  Filled 2021-07-17: qty 30

## 2021-07-17 MED ORDER — CHLORTHALIDONE 25 MG PO TABS
25.0000 mg | ORAL_TABLET | Freq: Every day | ORAL | Status: DC
  Administered 2021-07-17: 25 mg via ORAL
  Filled 2021-07-17 (×2): qty 1

## 2021-07-17 MED ORDER — PANTOPRAZOLE 80MG IVPB - SIMPLE MED
80.0000 mg | Freq: Two times a day (BID) | INTRAVENOUS | Status: DC
  Administered 2021-07-17: 80 mg via INTRAVENOUS
  Filled 2021-07-17: qty 80
  Filled 2021-07-17: qty 100

## 2021-07-17 NOTE — Progress Notes (Signed)
*  PRELIMINARY RESULTS* Echocardiogram 2D Echocardiogram has been performed.  Alec Ali 07/17/2021, 8:28 AM

## 2021-07-17 NOTE — Progress Notes (Signed)
Patient was seen and examined at bedside.    Nursing staff report of elevated blood pressure.  Still awaiting for transfer to [facility] for pacemaker placement.  Currently NPO.  Cardiology has initiated the patient on nitroglycerin drip for adequate blood pressure control.  Patient states that he does have intolerance to several antihypertensive medications.  Vitals reviewed with notable for elevated blood pressure.  Rest of the physical exam unchanged.  Vitals with BMI 07/17/2021 07/17/2021 07/17/2021  Height - - -  Weight - - -  BMI - - -  Systolic 163 182 334  Diastolic 63 62 61  Pulse 41 44 39  Plan.  Discharge pending to [facility].  Please refer to discharge instructions done on 07/16/2021.  Medically stable for disposition.

## 2021-07-17 NOTE — Progress Notes (Signed)
[facility] is here to transport pt to [facility]. Pt will be going to [facility] [address], room 14. Report given to receiving nurse Shanda Bumps, RN. Wife at bedside.

## 2021-07-17 NOTE — Progress Notes (Signed)
Progress Note  Patient Name: Alec Ali. Date of Encounter: 07/17/2021  [facility] Cardiologist: New ( Dr. Mariah Milling)  Subjective   Cardiac catheterization yesterday showed mild to moderate nonobstructive coronary artery disease.  He continues to be in complete heart block with frequent PVCs and short runs of idioventricular rhythm.  His blood pressure has been very difficult to control with poor tolerance to amlodipine and hydralazine.  He was on chlorthalidone as an outpatient.  He was started on nitroglycerin drip for blood pressure control and he feels significantly better.  Inpatient Medications    Scheduled Meds:  Chlorhexidine Gluconate Cloth  6 each Topical Daily   enoxaparin (LOVENOX) injection  0.5 mg/kg Subcutaneous Q24H   insulin aspart  0-15 Units Subcutaneous TID WC   insulin aspart  0-5 Units Subcutaneous QHS   losartan  100 mg Oral Daily   losartan  100 mg Oral Once   magnesium oxide  400 mg Oral BID   pantoprazole  40 mg Oral Daily   sodium chloride flush  3 mL Intravenous Q12H   Continuous Infusions:  sodium chloride     nitroGLYCERIN 30 mcg/min (07/17/21 1300)   PRN Meds: sodium chloride, acetaminophen **OR** acetaminophen, famotidine, hydrALAZINE, ondansetron **OR** ondansetron (ZOFRAN) IV, sodium chloride flush   Vital Signs    Vitals:   07/17/21 1230 07/17/21 1245 07/17/21 1300 07/17/21 1315  BP: 122/60 (!) 141/59 (!) 148/71 (!) 170/52  Pulse: (!) 42 (!) 46 (!) 48 61  Resp: 11 15 15 18   Temp:      TempSrc:      SpO2: 93% 91% 91% 93%  Weight:      Height:        Intake/Output Summary (Last 24 hours) at 07/17/2021 1340 Last data filed at 07/17/2021 1300 Gross per 24 hour  Intake 803.23 ml  Output 900 ml  Net -96.77 ml   Last 3 Weights 07/16/2021 07/14/2021 11/02/2020  Weight (lbs) 240 lb 240 lb 243 lb 6.4 oz  Weight (kg) 108.863 kg 108.863 kg 110.406 kg      Telemetry    Third-degree AV block with ventricular escape rhythm,  frequent PVCs and runs of idioventricular rhythm- Personally Reviewed  ECG     - Personally Reviewed  Physical Exam   GEN: No acute distress.   Neck: No JVD Cardiac: Bradycardic with premature beats, no murmurs, rubs, or gallops.  Respiratory: Clear to auscultation bilaterally. GI: Soft, nontender, non-distended  MS: No edema; No deformity. Neuro:  Nonfocal  Psych: Normal affect  Right radial pulses normal with no hematoma.  Labs    High Sensitivity Troponin:   Recent Labs  Lab 07/14/21 2351 07/15/21 0204 07/15/21 0414 07/15/21 0626  TROPONINIHS 58* 85* 91* 73*      Chemistry Recent Labs  Lab 07/14/21 2351 07/15/21 0626 07/15/21 2110 07/16/21 0447  NA 134* 138  --  136  K 3.1* 3.3* 3.2* 3.7  CL 98 103  --  104  CO2 26 26  --  25  GLUCOSE 132* 143*  --  130*  BUN 22* 17  --  13  CREATININE 0.84 0.69  --  0.75  CALCIUM 9.5 9.3  --  9.0  MG 2.0 1.9 2.0 2.0  GFRNONAA >60 >60  --  >60  ANIONGAP 10 9  --  7     Lipids No results for input(s): CHOL, TRIG, HDL, LABVLDL, LDLCALC, CHOLHDL in the last 168 hours.  Hematology Recent Labs  Lab 07/14/21  2351 07/16/21 0447  WBC 11.9* 10.4  RBC 5.27 5.12  HGB 15.7 15.1  HCT 44.2 43.2  MCV 83.9 84.4  MCH 29.8 29.5  MCHC 35.5 35.0  RDW 13.0 13.1  PLT 274 237    Thyroid  Recent Labs  Lab 07/14/21 2351  TSH 4.692*  FREET4 0.76     BNPNo results for input(s): BNP, PROBNP in the last 168 hours.  DDimer No results for input(s): DDIMER in the last 168 hours.   Radiology    CARDIAC CATHETERIZATION  Result Date: 07/16/2021   1st Mrg lesion is 50% stenosed.   Prox LAD to Mid LAD lesion is 30% stenosed.   The left ventricular systolic function is normal.   LV end diastolic pressure is mildly elevated.   The left ventricular ejection fraction is 55-65% by visual estimate. 1.  Mild to moderate nonobstructive coronary artery disease. 2.  Normal LV systolic function mildly elevated left ventricular end-diastolic  pressure. Recommendations: No culprit is identified for elevated troponin and complete heart block.  Will discuss with the EP regarding the need for a permanent pacemaker.    Cardiac Studies   Echocardiogram was personally reviewed by me.  Overall difficult to interpret due to frequent PVCs but the ejection fraction appears to be normal with no significant valvular abnormalities.  Patient Profile     60 y.o. male with history of diabetes type 2, hypertension, hyperlipidemia who presented on Friday with bradycardia, dizziness and chest burning sensation was found to be in complete heart block.  Troponin was mildly elevated.    Assessment & Plan    1.  Complete heart block: This is still persistent.  The patient has been on metoprolol for many years before this happened and thus unlikely to be the cause of complete heart block.  No other significant metabolic abnormalities to explain this.  Most recent EKG available on him before current presentation was in 2015 and that overall was unremarkable. Cardiac catheterization yesterday showed no significant obstructive coronary artery disease.  Echocardiogram was suboptimal but overall showed no significant abnormalities. I discussed the case with Dr. Quentin Ore and the patient is awaiting transfer to [facility] for permanent pacemaker placement with plans to obtain cardiac MRI prior to pacemaker implantation to look for etiologies for his complete heart block.   2.  Mildly elevated troponin: Likely due to supply demand ischemia in the setting of significant bradycardia.  Cardiac catheterization showed no obstructive disease.  Continue treatment of risk factors.    3. Essential hypertension: He seems to have refractory hypertension.  He was on metoprolol as an outpatient which was discontinued.  He did not tolerate amlodipine and hydralazine very well.  He is currently only on losartan and was started on nitroglycerin drip for better blood pressure  control.  I am going to resume his chlorthalidone which he was on as an outpatient and was well-tolerated.      For questions or updates, please contact [place] Please consult [email or web address] for contact info under        Signed, Kathlyn Sacramento, MD  07/17/2021, 1:40 PM

## 2021-07-18 ENCOUNTER — Telehealth: Payer: Self-pay | Admitting: Internal Medicine

## 2021-07-18 ENCOUNTER — Inpatient Hospital Stay (HOSPITAL_COMMUNITY): Payer: PRIVATE HEALTH INSURANCE

## 2021-07-18 ENCOUNTER — Other Ambulatory Visit: Payer: Self-pay

## 2021-07-18 ENCOUNTER — Encounter (HOSPITAL_COMMUNITY): Payer: Self-pay | Admitting: Cardiology

## 2021-07-18 DIAGNOSIS — I442 Atrioventricular block, complete: Secondary | ICD-10-CM

## 2021-07-18 DIAGNOSIS — I160 Hypertensive urgency: Secondary | ICD-10-CM

## 2021-07-18 LAB — GLUCOSE, CAPILLARY
Glucose-Capillary: 165 mg/dL — ABNORMAL HIGH (ref 70–99)
Glucose-Capillary: 143 mg/dL — ABNORMAL HIGH (ref 70–99)
Glucose-Capillary: 108 mg/dL — ABNORMAL HIGH (ref 70–99)
Glucose-Capillary: 133 mg/dL — ABNORMAL HIGH (ref 70–99)

## 2021-07-18 LAB — SURGICAL PCR SCREEN
MRSA, PCR: NEGATIVE
Staphylococcus aureus: NEGATIVE

## 2021-07-18 MED ORDER — NITROGLYCERIN IN D5W 200-5 MCG/ML-% IV SOLN
0.0000 ug/min | INTRAVENOUS | Status: DC
Start: 2021-07-18 — End: 2021-07-20
  Administered 2021-07-18 – 2021-07-19 (×2): 50 ug/min via INTRAVENOUS
  Administered 2021-07-19: 65 ug/min via INTRAVENOUS
  Filled 2021-07-18 (×2): qty 250

## 2021-07-18 MED ORDER — CHLORHEXIDINE GLUCONATE 4 % EX LIQD
60.0000 mL | Freq: Once | CUTANEOUS | Status: DC
  Filled 2021-07-18: qty 60

## 2021-07-18 MED ORDER — INSULIN ASPART 100 UNIT/ML IJ SOLN
0.0000 [IU] | Freq: Three times a day (TID) | INTRAMUSCULAR | Status: DC
  Administered 2021-07-18: 07:00:00 3 [IU] via SUBCUTANEOUS
  Administered 2021-07-18: 12:00:00 2 [IU] via SUBCUTANEOUS
  Administered 2021-07-19: 3 [IU] via SUBCUTANEOUS
  Administered 2021-07-19: 2 [IU] via SUBCUTANEOUS
  Administered 2021-07-20: 5 [IU] via SUBCUTANEOUS

## 2021-07-18 MED ORDER — LOSARTAN POTASSIUM 50 MG PO TABS
100.0000 mg | ORAL_TABLET | Freq: Every day | ORAL | Status: DC
  Administered 2021-07-18 – 2021-07-20 (×3): 100 mg via ORAL
  Filled 2021-07-18 (×3): qty 2

## 2021-07-18 MED ORDER — SODIUM CHLORIDE 0.9 % IV SOLN: INTRAVENOUS | Status: DC

## 2021-07-18 MED ORDER — INFLUENZA VAC SPLIT QUAD 0.5 ML IM SUSY
0.5000 mL | PREFILLED_SYRINGE | INTRAMUSCULAR | Status: AC
  Administered 2021-07-20: 0.5 mL via INTRAMUSCULAR
  Filled 2021-07-18: qty 0.5

## 2021-07-18 MED ORDER — ACETAMINOPHEN 325 MG PO TABS
650.0000 mg | ORAL_TABLET | ORAL | Status: DC | PRN
  Administered 2021-07-18 – 2021-07-20 (×4): 650 mg via ORAL
  Filled 2021-07-18 (×4): qty 2

## 2021-07-18 MED ORDER — ALPRAZOLAM 0.5 MG PO TABS
0.2500 mg | ORAL_TABLET | Freq: Once | ORAL | Status: AC
  Administered 2021-07-18: 14:00:00 0.25 mg via ORAL
  Filled 2021-07-18: qty 1

## 2021-07-18 MED ORDER — ONDANSETRON HCL 4 MG/2ML IJ SOLN: 4.0000 mg | Freq: Four times a day (QID) | INTRAMUSCULAR | Status: DC | PRN

## 2021-07-18 MED ORDER — NITROGLYCERIN 0.4 MG SL SUBL: 0.4000 mg | SUBLINGUAL_TABLET | SUBLINGUAL | Status: DC | PRN

## 2021-07-18 MED ORDER — MAGNESIUM OXIDE -MG SUPPLEMENT 400 (240 MG) MG PO TABS
400.0000 mg | ORAL_TABLET | Freq: Two times a day (BID) | ORAL | Status: DC
  Administered 2021-07-18 – 2021-07-20 (×5): 400 mg via ORAL
  Filled 2021-07-18 (×5): qty 1

## 2021-07-18 MED ORDER — GADOBUTROL 1 MMOL/ML IV SOLN
10.0000 mL | Freq: Once | INTRAVENOUS | Status: AC | PRN
  Administered 2021-07-18: 12:00:00 10 mL via INTRAVENOUS

## 2021-07-18 MED ORDER — MELATONIN 3 MG PO TABS: 3.0000 mg | ORAL_TABLET | Freq: Every evening | ORAL | Status: DC | PRN

## 2021-07-18 MED ORDER — CEFAZOLIN SODIUM-DEXTROSE 2-4 GM/100ML-% IV SOLN
2.0000 g | INTRAVENOUS | Status: AC
  Administered 2021-07-19: 2 g via INTRAVENOUS
  Filled 2021-07-18: qty 100

## 2021-07-18 MED ORDER — AMLODIPINE BESYLATE 10 MG PO TABS
10.0000 mg | ORAL_TABLET | Freq: Every day | ORAL | Status: DC
  Filled 2021-07-18 (×2): qty 1

## 2021-07-18 MED ORDER — LOSARTAN POTASSIUM 50 MG PO TABS: 50.0000 mg | ORAL_TABLET | Freq: Every day | ORAL | Status: DC

## 2021-07-18 MED ORDER — PANTOPRAZOLE SODIUM 40 MG PO TBEC
40.0000 mg | DELAYED_RELEASE_TABLET | Freq: Every day | ORAL | Status: DC
  Administered 2021-07-18 – 2021-07-20 (×3): 40 mg via ORAL
  Filled 2021-07-18 (×3): qty 1

## 2021-07-18 MED ORDER — ENOXAPARIN SODIUM 40 MG/0.4ML IJ SOSY: 40.0000 mg | PREFILLED_SYRINGE | INTRAMUSCULAR | Status: DC

## 2021-07-18 MED ORDER — HYDRALAZINE HCL 20 MG/ML IJ SOLN: 5.0000 mg | INTRAMUSCULAR | Status: DC | PRN

## 2021-07-18 MED ORDER — SODIUM CHLORIDE 0.9 % IV SOLN
80.0000 mg | INTRAVENOUS | Status: AC
  Administered 2021-07-19: 80 mg
  Filled 2021-07-18: qty 2

## 2021-07-18 MED ORDER — AMLODIPINE BESYLATE 5 MG PO TABS: 5.0000 mg | ORAL_TABLET | Freq: Every day | ORAL | Status: DC

## 2021-07-18 NOTE — Progress Notes (Addendum)
[facility] Response Progress Note   Reason for Call :  SBP >180, RN had titrated Nitro gtt x2 Pale skin color change during MRI  Initial Focused Assessment:  Pt evaluated following MRI imaging and was asymptomatic. He denied light headed or dizziness. His skin was pink, warm, dry. He endorses back pain from lying flat in MRI. HOB elevated to comfort. He stood and pivoted to bed from MRI stretcher without issue. Pt did insinuate stress, anxiety related to MRI.    VS following MRI scan: BP 154/64, HR 57, RR 20, SpO2 94% on room air  Interventions:  -No intervention from RR RN -PRN IV hydralazine held due to pt stating it causes his heart rate and BP to increase, RN to follow-up with provider  Event Summary:  MD Notified: A. Tillery, notified by primary RN Call Time: 1059 Arrival Time: 1105 End Time: 1150  Jennye Moccasin, RN

## 2021-07-18 NOTE — Telephone Encounter (Deleted)
Pt called in stating that she spoke with Dr. Darrick Huntsman . Pt stated that Dr. Darrick Huntsman had advise Pt if she still have the cough and coughing up mucus for Pt to call the office. Pt stated that she cough up mucus this morning and was wondering if Dr. Darrick Huntsman can prescribe her an antibiotic. Pt requesting callback.

## 2021-07-18 NOTE — Progress Notes (Signed)
Patient arrived to unit from [facility].  Dr. Lendell Caprice notified via text page.

## 2021-07-18 NOTE — Progress Notes (Addendum)
Pt transported to MRI for imaging. SWOT called to stay with patient during extended scan time of >1.5 hours. During scan, pt's BP escalated to >200s. Reassessment of pt revealed skin to be dusky. Lips had slight blue tint to them. Pt otherwise asymptomatic. O2 applied via [place] at 2.5 L. Cuff placement confirmed to be correct. Pt's BP resistant to increased titration. Pt refused Hydralazine stating adverse reaction the last time he was given the medication (INC HR and BP). Juliette Alcide with SWOT and [place] with [facility] at imaging suite for remainder of scan.    07/18/21 1022  Assess: MEWS Score  BP (!) 206/47  Pulse Rate (!) 47  Level of Consciousness Alert  SpO2 96 %  Assess: MEWS Score  MEWS Temp 0  MEWS Systolic 2  MEWS Pulse 1  MEWS RR 0  MEWS LOC 0  MEWS Score 3  MEWS Score Color Yellow  Assess: if the MEWS score is Yellow or Red  Were vital signs taken at a resting state? Yes  Focused Assessment Change from prior assessment (see assessment flowsheet)  Early Detection of Sepsis Score *See Row Information* Low  MEWS guidelines implemented *See Row Information* No, previously yellow, continue vital signs every 4 hours  Treat  Pain Scale 0-10  Pain Score 0  Take Vital Signs  Increase Vital Sign Frequency  Yellow: Q 2hr X 2 then Q 4hr X 2, if remains yellow, continue Q 4hrs  Escalate  MEWS: Escalate Yellow: discuss with charge nurse/RN and consider discussing with provider and RRT  Notify: Charge Nurse/RN  Name of Charge Nurse/RN Notified April Cooper, RN  Date Charge Nurse/RN Notified 07/05/21  Time Charge Nurse/RN Notified 1031  Notify: Provider  Provider Name/Title Lanna Poche, PA-C  Time Provider Notified 1015  Notification Type  (Chat)  Notification Reason Change in status  Provider response Other (Comment) (Titrate meds)  Time of Provider Response 1016

## 2021-07-18 NOTE — Telephone Encounter (Signed)
Done in error.

## 2021-07-18 NOTE — TOC Progression Note (Signed)
Transition of Care (TOC) - Progression Note    Patient Details  Name: Alec Ali. MRN: [id number] Date of Birth: 1960/10/15  Transition of Care [facility]) CM/SW Contact  Leone Haven, RN Phone Number: 07/18/2021, 4:41 PM  Clinical Narrative:     From  home, [facility],needs pacemaker, on nitro drip, needs cardiac mri.       Expected Discharge Plan and Services                                                 Social Determinants of Health (SDOH) Interventions    Readmission Risk Interventions No flowsheet data found.

## 2021-07-18 NOTE — Consult Note (Addendum)
ELECTROPHYSIOLOGY CONSULT NOTE    Patient ID: Alec Gibson. MRN: [id number], DOB/AGE: 03/17/1961 60 y.o.  Admit date: 07/17/2021 Date of Consult: 07/18/2021  Primary Physician: Sherlene Shams, MD Primary Cardiologist: Julien Nordmann, MD  Electrophysiologist: New  Referring Provider: Dr. Kirke Corin  Patient Profile: Alec Boring Ray Ibraham Levi. is a 60 y.o. male with a history of DM2, HTN, HLD, and GERD who is being seen today for the evaluation of complete heart block at the request of Dr. Kirke Corin.  HPI:  Alec Ray Waverly Ferrari. with medical history as above.   He was in his usual state of health until Friday, December 9, when he began to experience lightheadedness and HR into the 40s on his watch. He was on a beta-blocker as an outpatient and did not take it on Saturday, December 10.  Due to persistence of low heart rate and lightheadedness, he presented to [facility] on the evening of December 10 where he was found to be in complete heart block with PVCs and rates dipping into the 30s.    Pertinent labs on admit include K 3.1, Mg 2.0, HS trop 58 -> 91 -> 73, WBC 11.9. Respiratory panel negative.   Despite holding beta-blocker, patient continued to have complete heart block with PVCs and underwent diagnostic catheterization this morning which revealed minimal nonobstructive CAD and normal LV function with an EF of 55 to 65%.  After discussion between gen cards and EP, decision made to transfer pt to [facility] for further evaluation.  Today he is doing well. Feels better with BP controlled after started on NTG gtt yesterday. Denies syncope or family history of sudden cardiac death.  Past Medical History:  Diagnosis Date   GERD (gastroesophageal reflux disease)    EGD normal 08/08/2011 Oh    Hyperlipidemia    Hypertension    Nonobstructive CAD (coronary artery disease)    a. 07/2021 Cath: LM nl, LAD 30p/m, LCX mild diff dzs, OM1 50, RCA min irregs, RPDA/RPAV min irregs. EF 55-65%.     Surgical  History:  Past Surgical History:  Procedure Laterality Date   INGUINAL HERNIA REPAIR  1987   bilateral   LEFT HEART CATH AND CORONARY ANGIOGRAPHY N/A 07/16/2021   Procedure: LEFT HEART CATH AND CORONARY ANGIOGRAPHY;  Surgeon: Iran Ouch, MD;  Location: [facility] INVASIVE CV LAB;  Service: Cardiovascular;  Laterality: N/A;   TONSILLECTOMY       Medications Prior to Admission  Medication Sig Dispense Refill Last Dose   amLODipine (NORVASC) 5 MG tablet Take 1 tablet (5 mg total) by mouth daily.      ezetimibe (ZETIA) 10 MG tablet Take 1 tablet (10 mg total) by mouth daily. 90 tablet 0    famotidine (PEPCID AC MAXIMUM STRENGTH) 20 MG tablet Take 20 mg by mouth daily as needed for heartburn or indigestion.      losartan (COZAAR) 100 MG tablet Take 1 tablet (100 mg total) by mouth every evening. 90 tablet 1    magnesium oxide (MAG-OX) 400 (240 Mg) MG tablet Take 1 tablet (400 mg total) by mouth 2 (two) times daily.      Multiple Vitamins-Minerals (MENS 50+ MULTI VITAMIN/MIN) TABS Take 1 tablet by mouth daily.      pantoprazole (PROTONIX) 40 MG tablet Take 1 tablet (40 mg total) by mouth daily. 90 tablet 0     Inpatient Medications:   amLODipine  5 mg Oral Daily   insulin aspart  0-15 Units Subcutaneous TID WC  losartan  100 mg Oral Daily   magnesium oxide  400 mg Oral BID   pantoprazole  40 mg Oral Q0600    Allergies:  Allergies  Allergen Reactions   Lisinopril Palpitations and Other (See Comments)    "Didn't set well" Increased heart rate, chest pain.    Social History   Socioeconomic History   Marital status: Married    Spouse name: Not on file   Number of children: Not on file   Years of education: Not on file   Highest education level: Not on file  Occupational History   Occupation: CONSTRUCTION    Employer: SELF EMPLOYED    Comment: Full time  Tobacco Use   Smoking status: Former   Smokeless tobacco: Former    Types: Snuff  Substance and Sexual Activity    Alcohol use: Yes    Comment: Occasional   Drug use: No   Sexual activity: Yes  Other Topics Concern   Not on file  Social History Narrative   Married   Does not get regular exercise   Social Determinants of Health   Financial Resource Strain: Not on file  Food Insecurity: Not on file  Transportation Needs: Not on file  Physical Activity: Not on file  Stress: Not on file  Social Connections: Not on file  Intimate Partner Violence: Not on file     Family History  Problem Relation Age of Onset   Breast cancer Mother    Diabetes Mother    Thyroid disease Mother    Hypertension Father    COPD Father    Colon cancer Maternal Grandmother 67     Review of Systems: All other systems reviewed and are otherwise negative except as noted above.  Physical Exam: Vitals:   07/17/21 2319 07/18/21 0312 07/18/21 0320 07/18/21 0729  BP: (!) 166/53 118/68 (!) 156/53 (!) 174/50  Pulse: 73 100 (!) 45 (!) 42  Resp: 20 20 17  (!) 23  Temp: 98.4 F (36.9 C) 97.8 F (36.6 C) 98.1 F (36.7 C) 98 F (36.7 C)  TempSrc: Oral Oral Oral Oral  SpO2: 92% 93% 93% 94%  Weight: 109 kg     Height: 5\' 11"  (1.803 m)       GEN- The patient is well appearing, alert and oriented x 3 today.   HEENT: normocephalic, atraumatic; sclera clear, conjunctiva pink; hearing intact; oropharynx clear; neck supple Lungs- Clear to ausculation bilaterally, normal work of breathing.  No wheezes, rales, rhonchi Heart- Regular rate and rhythm, no murmurs, rubs or gallops GI- soft, non-tender, non-distended, bowel sounds present Extremities- no clubbing, cyanosis, or edema; DP/PT/radial pulses 2+ bilaterally MS- no significant deformity or atrophy Skin- warm and dry, no rash or lesion Psych- euthymic mood, full affect Neuro- strength and sensation are intact  Labs:   Lab Results  Component Value Date   WBC 10.4 07/16/2021   HGB 15.1 07/16/2021   HCT 43.2 07/16/2021   MCV 84.4 07/16/2021   PLT 237 07/16/2021     Recent Labs  Lab 07/16/21 0447  NA 136  K 3.7  CL 104  CO2 25  BUN 13  CREATININE 0.75  CALCIUM 9.0  GLUCOSE 130*      Radiology/Studies: CARDIAC CATHETERIZATION  Result Date: 07/16/2021   1st Mrg lesion is 50% stenosed.   Prox LAD to Mid LAD lesion is 30% stenosed.   The left ventricular systolic function is normal.   LV end diastolic pressure is mildly elevated.   The left  ventricular ejection fraction is 55-65% by visual estimate. 1.  Mild to moderate nonobstructive coronary artery disease. 2.  Normal LV systolic function mildly elevated left ventricular end-diastolic pressure. Recommendations: No culprit is identified for elevated troponin and complete heart block.  Quince Santana discuss with the EP regarding the need for a permanent pacemaker.   DG Chest Portable 1 View  Result Date: 07/15/2021 CLINICAL DATA:  Chest pain and shortness of breath. EXAM: PORTABLE CHEST 1 VIEW COMPARISON:  Chest radiograph dated 08/29/2014. FINDINGS: No focal consolidation, pleural effusion, or pneumothorax. Borderline cardiomegaly. No acute osseous pathology the IMPRESSION: No active disease. Electronically Signed   By: Elgie CollardArash  Radparvar M.D.   On: 07/15/2021 01:15   ECHOCARDIOGRAM COMPLETE  Result Date: 07/17/2021    ECHOCARDIOGRAM REPORT   Patient Name:   Alec GibsonDonnie Ray Thornton Jr. Date of Exam: 07/17/2021 Medical Rec #:  [id number]            Height:       71.0 in Accession #:    [id number]           Weight:       240.0 lb Date of Birth:  1961/05/16           BSA:          2.278 m Patient Age:    59 years             BP:           170/58 mmHg Patient Gender: M                    HR:           62 bpm. Exam Location:  [facility] Procedure: 2D Echo, Color Doppler, Cardiac Doppler and Intracardiac            Opacification Agent Indications:     I44.2 Heart block, complete  History:         Patient has no prior history of Echocardiogram examinations.                  Non-obstructive CAD; Risk Factors:Hypertension and                   Dyslipidemia.  Sonographer:     Humphrey RollsJoan Heiss Referring Phys:  [id number] MUHAMMAD A ARIDA Diagnosing Phys: Yvonne Kendallhristopher End MD  Sonographer Comments: Technically difficult study due to poor echo windows. Image acquisition challenging due to patient body habitus. IMPRESSIONS  1. Left ventricular ejection fraction, by estimation, is >55%. The left ventricle has normal function. Left ventricular endocardial border not optimally defined to evaluate regional wall motion. Left ventricular diastolic parameters are indeterminate.  2. Right ventricular systolic function is normal. The right ventricular size is normal.  3. The mitral valve is grossly normal. Trivial mitral valve regurgitation. No evidence of mitral stenosis.  4. The aortic valve has an indeterminant number of cusps. There is mild calcification of the aortic valve. There is moderate thickening of the aortic valve. Aortic valve regurgitation is not visualized. Aortic valve sclerosis/calcification is present, without any evidence of aortic stenosis.  5. The inferior vena cava is normal in size with greater than 50% respiratory variability, suggesting right atrial pressure of 3 mmHg. FINDINGS  Left Ventricle: Left ventricular ejection fraction, by estimation, is >55%. The left ventricle has normal function. Left ventricular endocardial border not optimally defined to evaluate regional wall motion. Definity contrast agent was given IV to delineate the left ventricular endocardial borders. The left  ventricular internal cavity size was normal in size. There is no left ventricular hypertrophy. Left ventricular diastolic parameters are indeterminate. Right Ventricle: The right ventricular size is normal. No increase in right ventricular wall thickness. Right ventricular systolic function is normal. Left Atrium: Left atrial size was normal in size. Right Atrium: Right atrial size was normal in size. Pericardium: There is no evidence of pericardial effusion. Mitral  Valve: The mitral valve is grossly normal. Trivial mitral valve regurgitation. No evidence of mitral valve stenosis. MV peak gradient, 2.3 mmHg. The mean mitral valve gradient is 1.0 mmHg. Tricuspid Valve: The tricuspid valve is not well visualized. Tricuspid valve regurgitation is not demonstrated. Aortic Valve: The aortic valve has an indeterminant number of cusps. There is mild calcification of the aortic valve. There is moderate thickening of the aortic valve. Aortic valve regurgitation is not visualized. Aortic valve sclerosis/calcification is present, without any evidence of aortic stenosis. Aortic valve mean gradient measures 5.0 mmHg. Aortic valve peak gradient measures 10.5 mmHg. Aortic valve area, by VTI measures 2.94 cm. Pulmonic Valve: The pulmonic valve was not well visualized. Pulmonic valve regurgitation is not visualized. No evidence of pulmonic stenosis. Aorta: The aortic root is normal in size and structure. Pulmonary Artery: The pulmonary artery is not well seen. Venous: The inferior vena cava is normal in size with greater than 50% respiratory variability, suggesting right atrial pressure of 3 mmHg. IAS/Shunts: The interatrial septum was not well visualized.  LEFT VENTRICLE PLAX 2D LVIDd:         4.98 cm   Diastology LVIDs:         3.19 cm   LV e' medial:    9.46 cm/s LV PW:         1.02 cm   LV E/e' medial:  6.7 LV IVS:        0.96 cm   LV e' lateral:   9.90 cm/s LVOT diam:     2.20 cm   LV E/e' lateral: 6.4 LV SV:         86 LV SV Index:   38 LVOT Area:     3.80 cm  RIGHT VENTRICLE RV Basal diam:  3.95 cm LEFT ATRIUM           Index        RIGHT ATRIUM           Index LA diam:      4.00 cm 1.76 cm/m   RA Area:     10.80 cm LA Vol (A2C): 26.0 ml 11.41 ml/m  RA Volume:   21.90 ml  9.61 ml/m LA Vol (A4C): 56.8 ml 24.93 ml/m  AORTIC VALVE                     PULMONIC VALVE AV Area (Vmax):    2.68 cm      PV Vmax:       0.95 m/s AV Area (Vmean):   2.94 cm      PV Vmean:      71.400 cm/s AV  Area (VTI):     2.94 cm      PV VTI:        0.184 m AV Vmax:           162.00 cm/s   PV Peak grad:  3.6 mmHg AV Vmean:          104.000 cm/s  PV Mean grad:  2.0 mmHg AV VTI:  0.292 m AV Peak Grad:      10.5 mmHg AV Mean Grad:      5.0 mmHg LVOT Vmax:         114.00 cm/s LVOT Vmean:        80.300 cm/s LVOT VTI:          0.226 m LVOT/AV VTI ratio: 0.77  AORTA Ao Root diam: 3.30 cm MITRAL VALVE MV Area (PHT): 3.06 cm    SHUNTS MV Area VTI:   4.13 cm    Systemic VTI:  0.23 m MV Peak grad:  2.3 mmHg    Systemic Diam: 2.20 cm MV Mean grad:  1.0 mmHg MV Vmax:       0.76 m/s MV Vmean:      44.7 cm/s MV Decel Time: 248 msec MV E velocity: 63.60 cm/s MV A velocity: 75.20 cm/s MV E/A ratio:  0.85 Harrell Gave End MD Electronically signed by Nelva Bush MD Signature Date/Time: 07/17/2021/2:19:11 PM    Final     EKG:CHB with PVCs HR 30-40s (personally reviewed)  TELEMETRY: CHB with PVCs 30-40s (personally reviewed)  Assessment/Plan: 1.  CHB No significant CAD on cath. Normal EF.  Concern for infiltrative process such as sarcoid or amyloid, so plan cMRI prior to pacing.  Ultimately, Raudel Bazen require pacemaker  2. HTN urgency BP has been 150-170s on NTG gtt Would ideally wean so can go down to cMRI without escort.  Give am losartan and prn hydralazine.   3. PVCs Follow. May be brady mediated. Najae Filsaime likely resume BB for HTN post PPM and follow.    For questions or updates, please contact [place] Please consult [email or web address] for contact info under Cardiology/STEMI.  Signed, Shirley Friar, PA-C  07/18/2021 7:36 AM   I have seen and examined this patient with Oda Kilts.  Agree with above, note added to reflect my findings.  Was in his usual state of health until last week when he noticed that his heart rates were in the 40s.  He had lightheadedness and presented to [facility] ER.  ECG showed complete heart block.  He was transferred to [facility] for possible pacemaker  implant.  GEN: Well nourished, well developed, in no acute distress  HEENT: normal  Neck: no JVD, carotid bruits, or masses Cardiac: Bradycardic; no murmurs, rubs, or gallops,no edema  Respiratory:  clear to auscultation bilaterally, normal work of breathing GI: soft, nontender, nondistended, + BS MS: no deformity or atrophy  Skin: warm and dry Neuro:  Strength and sensation are intact Psych: euthymic mood, full affect   Complete heart block: Unclear as to the cause of his heart block.  He does have some thickening around his aortic valve.  Due to his young age, we Jonathyn Carothers plan for cardiac MRI to further determine if he has any infiltrative disease causing his heart block.  He Alishea Beaudin need pacemaker implant.  We Gregorey Nabor likely plan for tomorrow after his MRI. Hypertensive urgency.  Currently on IV nitroglycerin.  Has received his home losartan.  We Kyleigh Nannini hopefully be able to stop nitroglycerin today.  This could be related to his heart block.  [employer] M. Decklan Mau MD 07/18/2021 8:53 AM

## 2021-07-19 ENCOUNTER — Encounter (HOSPITAL_COMMUNITY)
Admission: AD | Disposition: A | Discharge status: Home / Self Care | Payer: Self-pay | Source: Other Acute Inpatient Hospital | Attending: Cardiology

## 2021-07-19 HISTORY — PX: PACEMAKER IMPLANT: EP1218

## 2021-07-19 LAB — BASIC METABOLIC PANEL
Anion gap: 9 (ref 5–15)
BUN: 16 mg/dL (ref 6–20)
CO2: 25 mmol/L (ref 22–32)
Calcium: 8.7 mg/dL — ABNORMAL LOW (ref 8.9–10.3)
Chloride: 99 mmol/L (ref 98–111)
Creatinine, Ser: 1.13 mg/dL (ref 0.61–1.24)
GFR, Estimated: >60 mL/min (ref >60)
Glucose, Bld: 193 mg/dL — ABNORMAL HIGH (ref 70–99)
Potassium: 3.5 mmol/L (ref 3.5–5.1)
Sodium: 133 mmol/L — ABNORMAL LOW (ref 135–145)

## 2021-07-19 LAB — CBC
HCT: 36.6 % — ABNORMAL LOW (ref 39.0–52.0)
Hemoglobin: 12.8 g/dL — ABNORMAL LOW (ref 13.0–17.0)
MCH: 30 pg (ref 26.0–34.0)
MCHC: 35 g/dL (ref 30.0–36.0)
MCV: 85.7 fL (ref 80.0–100.0)
Platelets: 216 10*3/uL (ref 150–400)
RBC: 4.27 MIL/uL (ref 4.22–5.81)
RDW: 12.8 % (ref 11.5–15.5)
WBC: 12.4 10*3/uL — ABNORMAL HIGH (ref 4.0–10.5)
nRBC: 0 % (ref 0.0–0.2)

## 2021-07-19 LAB — GLUCOSE, CAPILLARY
Glucose-Capillary: 169 mg/dL — ABNORMAL HIGH (ref 70–99)
Glucose-Capillary: 129 mg/dL — ABNORMAL HIGH (ref 70–99)
Glucose-Capillary: 111 mg/dL — ABNORMAL HIGH (ref 70–99)
Glucose-Capillary: 137 mg/dL — ABNORMAL HIGH (ref 70–99)

## 2021-07-19 LAB — MAGNESIUM: Magnesium: 1.9 mg/dL (ref 1.7–2.4)

## 2021-07-19 LAB — HEMOGLOBIN A1C
Hgb A1c MFr Bld: 6.2 % — ABNORMAL HIGH (ref 4.8–5.6)
Mean Plasma Glucose: 131.24 mg/dL

## 2021-07-19 SURGERY — PACEMAKER IMPLANT

## 2021-07-19 MED ORDER — SODIUM CHLORIDE 0.9 % IV SOLN
INTRAVENOUS | Status: AC
  Filled 2021-07-19: qty 2

## 2021-07-19 MED ORDER — LIDOCAINE HCL (PF) 1 % IJ SOLN
INTRAMUSCULAR | Status: AC
  Filled 2021-07-19: qty 60

## 2021-07-19 MED ORDER — HEPARIN (PORCINE) IN NACL 1000-0.9 UT/500ML-% IV SOLN
INTRAVENOUS | Status: DC | PRN
  Administered 2021-07-19: 500 mL

## 2021-07-19 MED ORDER — LIDOCAINE HCL (PF) 1 % IJ SOLN
INTRAMUSCULAR | Status: DC | PRN
  Administered 2021-07-19: 40 mL

## 2021-07-19 MED ORDER — CEFAZOLIN SODIUM-DEXTROSE 2-4 GM/100ML-% IV SOLN
INTRAVENOUS | Status: AC
  Filled 2021-07-19: qty 100

## 2021-07-19 MED ORDER — MIDAZOLAM HCL 5 MG/5ML IJ SOLN
INTRAMUSCULAR | Status: AC
  Filled 2021-07-19: qty 5

## 2021-07-19 MED ORDER — MIDAZOLAM HCL 5 MG/5ML IJ SOLN
INTRAMUSCULAR | Status: DC | PRN
  Administered 2021-07-19: 1 mg via INTRAVENOUS

## 2021-07-19 MED ORDER — FENTANYL CITRATE (PF) 100 MCG/2ML IJ SOLN
INTRAMUSCULAR | Status: AC
  Filled 2021-07-19: qty 2

## 2021-07-19 MED ORDER — HEPARIN (PORCINE) IN NACL 1000-0.9 UT/500ML-% IV SOLN
INTRAVENOUS | Status: AC
  Filled 2021-07-19: qty 500

## 2021-07-19 MED ORDER — FENTANYL CITRATE (PF) 100 MCG/2ML IJ SOLN
INTRAMUSCULAR | Status: DC | PRN
  Administered 2021-07-19: 25 ug via INTRAVENOUS

## 2021-07-19 MED ORDER — LIVING WELL WITH DIABETES BOOK
Freq: Once | Status: AC
  Filled 2021-07-19: qty 1

## 2021-07-19 SURGICAL SUPPLY — 14 items
CABLE SURGICAL S-101-97-12 (CABLE) ×2 IMPLANT
KIT ACCESSORY SELECTRA FIX CVD (MISCELLANEOUS) ×1 IMPLANT
LEAD SELECTRA 3D-55-42 (CATHETERS) ×1 IMPLANT
LEAD SELECTRA 3D-65-42 (CATHETERS) ×1 IMPLANT
LEAD SOLIA S PRO MRI 53 (Lead) ×1 IMPLANT
LEAD SOLIA S PRO MRI 60 (Lead) ×1 IMPLANT
MAT PREVALON FULL STRYKER (MISCELLANEOUS) ×1 IMPLANT
PACEMAKER EDORA 8DR-T MRI (Pacemaker) ×1 IMPLANT
PAD DEFIB RADIO PHYSIO CONN (PAD) ×2 IMPLANT
SHEATH 7FR PRELUDE SNAP 13 (SHEATH) ×1 IMPLANT
SHEATH 9FR PRELUDE SNAP 13 (SHEATH) ×1 IMPLANT
SHEATH PROBE COVER 6X72 (BAG) ×1 IMPLANT
TRAY PACEMAKER INSERTION (PACKS) ×2 IMPLANT
WIRE HI TORQ VERSACORE-J 145CM (WIRE) ×1 IMPLANT

## 2021-07-19 NOTE — Plan of Care (Signed)
°  Problem: Elimination: Goal: Will not experience complications related to bowel motility Outcome: Progressing Goal: Will not experience complications related to urinary retention Outcome: Progressing   Problem: Safety: Goal: Ability to remain free from injury will improve Outcome: Progressing   Problem: Skin Integrity: Goal: Risk for impaired skin integrity will decrease Outcome: Progressing   Problem: Health Behavior/Discharge Planning: Goal: Ability to manage health-related needs will improve Outcome: Not Progressing   Problem: Clinical Measurements: Goal: Ability to maintain clinical measurements within normal limits will improve Outcome: Not Progressing   Problem: Pain Managment: Goal: General experience of comfort will improve Outcome: Not Progressing

## 2021-07-19 NOTE — Progress Notes (Signed)
Pt endorses warmth in right leg and cold in the left. Assessment reveals pink extremity, warm to the touch on the right leg, and cold to the touch on the left leg. Pulses are 2+ bilaterally cap refill less than 3 seconds. Pt does not endorse pain in either calves. No swelling of either extremity is notated. Vitals signs stable, no change in respirations, HR, or level of pain.   [facility] on call paged at [phone]. No response.   [facility] paged a second time at [phone]. No response.

## 2021-07-19 NOTE — Progress Notes (Signed)
Inpatient Diabetes Program Recommendations  AACE/ADA: New Consensus Statement on Inpatient Glycemic Control (2015)  Target Ranges:  Prepandial:   less than 140 mg/dL      Peak postprandial:   less than 180 mg/dL (1-2 hours)      Critically ill patients:  140 - 180 mg/dL   Lab Results  Component Value Date   GLUCAP 129 (H) 07/19/2021   HGBA1C 6.2 (H) 07/19/2021    Review of Glycemic Control  Diabetes history: Pre-DM2 Outpatient Diabetes medications: None Current orders for Inpatient glycemic control: Novolog 0-15 units TID  Consult received for DM2, A1C 6.2%.  Patient is currently in cath lab.  Spoke with wife.  She sates he has had pre-DM for approximately 5 yrs. He is current with his PCP and his A1C is usually 6-6.3%.  They eat a carb modified diet, do not drink beverage with sugar and are physically active.    Ordered LWWD booklet, discussed The Plate Method and encouraged continued f/u with PCP.    Will continue to follow while inpatient.  Thank you, Dulce Sellar, RN, BSN Diabetes Coordinator Inpatient Diabetes Program [phone] (team pager from 8a-5p)

## 2021-07-19 NOTE — Plan of Care (Signed)
  Problem: Clinical Measurements: Goal: Ability to maintain clinical measurements within normal limits will improve Outcome: Not Progressing   Problem: Coping: Goal: Level of anxiety will decrease Outcome: Not Progressing   

## 2021-07-19 NOTE — Progress Notes (Signed)
Electrophysiology Rounding Note  Patient Name: Alec Ali. Date of Encounter: 07/19/2021  Primary Cardiologist: Ida Rogue, MD Electrophysiologist: New to Dr. Quentin Ore   Subjective   The patient is doing well today.  At this time, the patient denies chest pain, shortness of breath, or any new concerns.  Inpatient Medications    Scheduled Meds:  amLODipine  10 mg Oral Daily   chlorhexidine  60 mL Topical Once   chlorhexidine  60 mL Topical Once   gentamicin irrigation  80 mg Irrigation To Cath   [START ON 07/20/2021] influenza vac split quadrivalent PF  0.5 mL Intramuscular Tomorrow-1000   insulin aspart  0-15 Units Subcutaneous TID WC   losartan  100 mg Oral Daily   magnesium oxide  400 mg Oral BID   pantoprazole  40 mg Oral Q0600   Continuous Infusions:  sodium chloride     sodium chloride 50 mL/hr at 07/19/21 0654    ceFAZolin (ANCEF) IV     nitroGLYCERIN 65 mcg/min (07/18/21 2305)   PRN Meds: acetaminophen, hydrALAZINE, melatonin, nitroGLYCERIN, ondansetron (ZOFRAN) IV   Vital Signs    Vitals:   07/18/21 2304 07/18/21 2317 07/19/21 0529 07/19/21 0724  BP: (!) 189/40 (!) 169/44 (!) 145/51 (!) 168/48  Pulse:   (!) 42 (!) 41  Resp:   20 19  Temp:  98.2 F (36.8 C) 97.9 F (36.6 C) 97.8 F (36.6 C)  TempSrc:  Oral Oral Oral  SpO2:   93% 92%  Weight:   108 kg   Height:        Intake/Output Summary (Last 24 hours) at 07/19/2021 0749 Last data filed at 07/19/2021 0500 Gross per 24 hour  Intake 316.56 ml  Output 400 ml  Net -83.44 ml   Filed Weights   07/17/21 2319 07/19/21 0529  Weight: 109 kg 108 kg    Physical Exam    GEN- The patient is well appearing, alert and oriented x 3 today.   Head- normocephalic, atraumatic Eyes-  Sclera clear, conjunctiva pink Ears- hearing intact Oropharynx- clear Neck- supple Lungs- Clear to ausculation bilaterally, normal work of breathing Heart-  Slow and regular  rate and rhythm, no murmurs, rubs or  gallops GI- soft, NT, ND, + BS Extremities- no clubbing or cyanosis. No edema Skin- no rash or lesion Psych- euthymic mood, full affect Neuro- strength and sensation are intact  Labs    CBC Recent Labs    07/19/21 0031  WBC 12.4*  HGB 12.8*  HCT 36.6*  MCV 85.7  PLT [id number]   Basic Metabolic Panel Recent Labs    07/19/21 0031  NA 133*  K 3.5  CL 99  CO2 25  GLUCOSE 193*  BUN 16  CREATININE 1.13  CALCIUM 8.7*  MG 1.9   Liver Function Tests No results for input(s): AST, ALT, ALKPHOS, BILITOT, PROT, ALBUMIN in the last 72 hours. No results for input(s): LIPASE, AMYLASE in the last 72 hours. Cardiac Enzymes No results for input(s): CKTOTAL, CKMB, CKMBINDEX, TROPONINI in the last 72 hours.   Telemetry    CHB with HRs 40-50s mostly, intermittent brief periods of NSR (personally reviewed)  Radiology    CT Chest High Resolution  Result Date: 07/19/2021 CLINICAL DATA:  Cardiac sarcoidosis suspected EXAM: CT CHEST WITHOUT CONTRAST TECHNIQUE: Multidetector CT imaging of the chest was performed following the standard protocol without intravenous contrast. High resolution imaging of the lungs, as well as inspiratory and expiratory imaging, was performed. COMPARISON:  None. FINDINGS:  Cardiovascular: No significant vascular findings. Normal heart size. Three-vessel coronary artery calcifications. No pericardial effusion. Mediastinum/Nodes: Prominent, although not pathologically enlarged subcentimeter mediastinal and hilar lymph nodes. Thyroid gland, trachea, and esophagus demonstrate no significant findings. Lungs/Pleura: Minimal, bland appearing, bandlike scarring of the bilateral lung bases. No significant air trapping on expiratory phase imaging. No pleural effusion or pneumothorax. Upper Abdomen: No acute abnormality.  Hepatic steatosis. Musculoskeletal: No chest wall mass or suspicious bone lesions identified. IMPRESSION: 1. Minimal, bland appearing, bandlike scarring of the  bilateral lung bases. No evidence of pulmonary sarcoidosis or fibrotic interstitial lung disease. 2. Prominent, although not pathologically enlarged subcentimeter mediastinal and hilar lymph nodes, nonspecific and likely reactive. 3. Coronary artery disease. 4. Hepatic steatosis. Electronically Signed   By: Jearld Lesch M.D.   On: 07/19/2021 07:09   MR CARDIAC MORPHOLOGY W WO CONTRAST  Result Date: 07/18/2021 CLINICAL DATA:  Clinical question of infiltrative disease Study assumes HCT of 43 and BSA of 2.34 m2 EXAM: CARDIAC MRI TECHNIQUE: The patient was scanned on a 1.5 Tesla GE magnet. A dedicated cardiac coil was used. Functional imaging was done using Fiesta sequences. 2,3, and 4 chamber views were done to assess for RWMA's. Modified Simpson's rule using a short axis stack was used to calculate an ejection fraction on a dedicated work [employer]. The patient received 10 cc of Gadavist. After 10 minutes inversion recovery sequences were used to assess for infiltration and scar tissue. CONTRAST:  10 cc  of Gadavist FINDINGS: 1. Normal left ventricular size when indexed, with LVEDD 72 mm, but LVEDVi 79 mL/m2. Normal left ventricular thickness, with intraventricular septal thickness of 9 mm, posterior wall thickness of 10 mm, and myocardial mass index of 79 g/m2. Mildly decreased left ventricular systolic function (LVEF =50%). There are no regional wall motion abnormalities. Reduced LV global longitudinal strain (-9.9%). Left ventricular parametric mapping notable for elevation in both ECV and T1 signal. ECV elevated apical septum (40%), apical inferior (33%), mid inferolateral (33%), basal septal (33%). T2 signal elevated in the apical inferior (61%). There is late gadolinium enhancement in the left ventricular myocardium. Using a 5 Standard deviation assessment, there is patchy late gadolinium enhancement in the inferolateral base that encompasses 1% of the LV myocardium. In addition there  is patchy mid myocardial in the mid septum and the inferolateral LGE appears larger visually than the quantitative assessment. 2. Normal right ventricular size with RVEDVI 110 mL/m2. Normal right ventricular thickness. Normal right ventricular systolic function (RVEF =64%). There are no regional wall motion abnormalities or aneurysms. 3.  Normal left and right atrial size. 4. Normal size of the aortic root, ascending aorta and pulmonary artery. 5. Valve assessment: Aortic Valve: Not well assessed. Pulmonic Valve: Not well assessed. Tricuspid Valve: Qualitative mild tricuspid regurgitation. Mitral Valve: No significant regurgitation 6.  Normal pericardium.  No pericardial effusion. 7. Grossly, no extracardiac findings. Recommended dedicated study if concerned for non-cardiac pathology. 8. Wrap around artifact noted significant breath hold artifact. This decreased the sensitivity of quantitative functional assessment and LGE assessment. IMPRESSION: Mildly decrease LVEF. Patchy LGE with elevated ECV and T2 signal. This can be seen in both inflammatory cardiomyopathies including cardiac sarcoidosis. This is less suggestive of cardiac amyloidosis. Riley Lam MD Electronically Signed   By: Riley Lam M.D.   On: 07/18/2021 12:54   ECHOCARDIOGRAM COMPLETE  Result Date: 07/17/2021    ECHOCARDIOGRAM REPORT   Patient Name:   [facility]. Date of Exam: 07/17/2021 Medical  Rec #:  [id number]            Height:       71.0 in Accession #:    [id number]           Weight:       240.0 lb Date of Birth:  Dec 17, 1960           BSA:          2.278 m Patient Age:    60 years             BP:           170/58 mmHg Patient Gender: M                    HR:           62 bpm. Exam Location:  [facility] Procedure: 2D Echo, Color Doppler, Cardiac Doppler and Intracardiac            Opacification Agent Indications:     I44.2 Heart block, complete  History:         Patient has no prior history of Echocardiogram  examinations.                  Non-obstructive CAD; Risk Factors:Hypertension and                  Dyslipidemia.  Sonographer:     Charmayne Sheer Referring Phys:  [id number] ARIDA Diagnosing Phys: Nelva Bush MD  Sonographer Comments: Technically difficult study due to poor echo windows. Image acquisition challenging due to patient body habitus. IMPRESSIONS  1. Left ventricular ejection fraction, by estimation, is >55%. The left ventricle has normal function. Left ventricular endocardial border not optimally defined to evaluate regional wall motion. Left ventricular diastolic parameters are indeterminate.  2. Right ventricular systolic function is normal. The right ventricular size is normal.  3. The mitral valve is grossly normal. Trivial mitral valve regurgitation. No evidence of mitral stenosis.  4. The aortic valve has an indeterminant number of cusps. There is mild calcification of the aortic valve. There is moderate thickening of the aortic valve. Aortic valve regurgitation is not visualized. Aortic valve sclerosis/calcification is present, without any evidence of aortic stenosis.  5. The inferior vena cava is normal in size with greater than 50% respiratory variability, suggesting right atrial pressure of 3 mmHg. FINDINGS  Left Ventricle: Left ventricular ejection fraction, by estimation, is >55%. The left ventricle has normal function. Left ventricular endocardial border not optimally defined to evaluate regional wall motion. Definity contrast agent was given IV to delineate the left ventricular endocardial borders. The left ventricular internal cavity size was normal in size. There is no left ventricular hypertrophy. Left ventricular diastolic parameters are indeterminate. Right Ventricle: The right ventricular size is normal. No increase in right ventricular wall thickness. Right ventricular systolic function is normal. Left Atrium: Left atrial size was normal in size. Right Atrium: Right atrial size  was normal in size. Pericardium: There is no evidence of pericardial effusion. Mitral Valve: The mitral valve is grossly normal. Trivial mitral valve regurgitation. No evidence of mitral valve stenosis. MV peak gradient, 2.3 mmHg. The mean mitral valve gradient is 1.0 mmHg. Tricuspid Valve: The tricuspid valve is not well visualized. Tricuspid valve regurgitation is not demonstrated. Aortic Valve: The aortic valve has an indeterminant number of cusps. There is mild calcification of the aortic valve. There is moderate thickening of the aortic valve. Aortic valve regurgitation is not  visualized. Aortic valve sclerosis/calcification is present, without any evidence of aortic stenosis. Aortic valve mean gradient measures 5.0 mmHg. Aortic valve peak gradient measures 10.5 mmHg. Aortic valve area, by VTI measures 2.94 cm. Pulmonic Valve: The pulmonic valve was not well visualized. Pulmonic valve regurgitation is not visualized. No evidence of pulmonic stenosis. Aorta: The aortic root is normal in size and structure. Pulmonary Artery: The pulmonary artery is not well seen. Venous: The inferior vena cava is normal in size with greater than 50% respiratory variability, suggesting right atrial pressure of 3 mmHg. IAS/Shunts: The interatrial septum was not well visualized.  LEFT VENTRICLE PLAX 2D LVIDd:         4.98 cm   Diastology LVIDs:         3.19 cm   LV e' medial:    9.46 cm/s LV PW:         1.02 cm   LV E/e' medial:  6.7 LV IVS:        0.96 cm   LV e' lateral:   9.90 cm/s LVOT diam:     2.20 cm   LV E/e' lateral: 6.4 LV SV:         86 LV SV Index:   38 LVOT Area:     3.80 cm  RIGHT VENTRICLE RV Basal diam:  3.95 cm LEFT ATRIUM           Index        RIGHT ATRIUM           Index LA diam:      4.00 cm 1.76 cm/m   RA Area:     10.80 cm LA Vol (A2C): 26.0 ml 11.41 ml/m  RA Volume:   21.90 ml  9.61 ml/m LA Vol (A4C): 56.8 ml 24.93 ml/m  AORTIC VALVE                     PULMONIC VALVE AV Area (Vmax):    2.68 cm       PV Vmax:       0.95 m/s AV Area (Vmean):   2.94 cm      PV Vmean:      71.400 cm/s AV Area (VTI):     2.94 cm      PV VTI:        0.184 m AV Vmax:           162.00 cm/s   PV Peak grad:  3.6 mmHg AV Vmean:          104.000 cm/s  PV Mean grad:  2.0 mmHg AV VTI:            0.292 m AV Peak Grad:      10.5 mmHg AV Mean Grad:      5.0 mmHg LVOT Vmax:         114.00 cm/s LVOT Vmean:        80.300 cm/s LVOT VTI:          0.226 m LVOT/AV VTI ratio: 0.77  AORTA Ao Root diam: 3.30 cm MITRAL VALVE MV Area (PHT): 3.06 cm    SHUNTS MV Area VTI:   4.13 cm    Systemic VTI:  0.23 m MV Peak grad:  2.3 mmHg    Systemic Diam: 2.20 cm MV Mean grad:  1.0 mmHg MV Vmax:       0.76 m/s MV Vmean:      44.7 cm/s MV Decel Time: 248 msec MV E velocity: 63.60  cm/s MV A velocity: 75.20 cm/s MV E/A ratio:  0.85 Nelva Bush MD Electronically signed by Nelva Bush MD Signature Date/Time: 07/17/2021/2:19:11 PM    Final     Patient Profile     Shloima Ray Himmat Propps. is a 60 y.o. male with a history of DM2, HTN, HLD, and GERD who is being seen today for the evaluation of complete heart block at the request of Dr. Fletcher Anon.  Assessment & Plan    1.  CHB No significant CAD on cath. Normal EF.  cMRI most consistent with prior myocarditis. Explained risks, benefits, and alternatives to PPM implantation, including but not limited to bleeding, infection, pneumothorax, pericardial effusion, lead dislodgement, heart attack, stroke, or death.  Pt verbalized understanding and agrees to proceed     2. HTN urgency BP has been 150-170s on NTG gtt Continue am losartan.  Likely to improve post pacing   3. PVCs Follow. May be brady mediated. Will likely resume BB for HTN post PPM and follow.   4. 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 Patchy LGE with elevated ECV and T2 signal. This can be seen in both inflammatory cardiomyopathies including cardiac  sarcoidosis. This is less suggestive of cardiac amyloidosis.  Plan for pacing this afternoon.   For questions or updates, please contact [place] Please consult [email or web address] for contact info under Cardiology/STEMI.  Signed, Shirley Friar, PA-C  07/19/2021, 7:49 AM

## 2021-07-20 ENCOUNTER — Inpatient Hospital Stay (HOSPITAL_COMMUNITY): Payer: PRIVATE HEALTH INSURANCE

## 2021-07-20 ENCOUNTER — Encounter (HOSPITAL_COMMUNITY): Payer: Self-pay | Admitting: Cardiology

## 2021-07-20 LAB — GLUCOSE, CAPILLARY
Glucose-Capillary: 119 mg/dL — ABNORMAL HIGH (ref 70–99)
Glucose-Capillary: 217 mg/dL — ABNORMAL HIGH (ref 70–99)

## 2021-07-20 MED ORDER — ACETAMINOPHEN 325 MG PO TABS: 650.0000 mg | ORAL_TABLET | ORAL | Status: DC | PRN

## 2021-07-20 MED ORDER — CARVEDILOL 12.5 MG PO TABS: 12.5000 mg | ORAL_TABLET | Freq: Two times a day (BID) | ORAL | 6 refills | Status: DC

## 2021-07-20 MED ORDER — CARVEDILOL 12.5 MG PO TABS: 12.5000 mg | ORAL_TABLET | Freq: Two times a day (BID) | ORAL | Status: DC

## 2021-07-20 MED ORDER — CHLORTHALIDONE 25 MG PO TABS: 25.0000 mg | ORAL_TABLET | Freq: Every day | ORAL | 3 refills | Status: DC

## 2021-07-20 MED ORDER — CHLORTHALIDONE 25 MG PO TABS
25.0000 mg | ORAL_TABLET | Freq: Every day | ORAL | Status: DC
  Administered 2021-07-20: 25 mg via ORAL
  Filled 2021-07-20: qty 1

## 2021-07-20 NOTE — Progress Notes (Signed)
° °  Seen with Dr. Lalla Brothers this am  Doing well without complaint post op.   NTG gtt at 5.  Has been off home chlorthalidone.  Will give dose this am, stop NTG and reassess BP prior to likely discharge this afternoon.    Full note pending disposition.   Casimiro Needle [address]" [place], PA-C  07/20/2021 8:17 AM

## 2021-07-20 NOTE — Discharge Instructions (Signed)
After Your Pacemaker   You have a Biotronik Pacemaker  ACTIVITY Do not lift your arm above shoulder height for 1 week after your procedure. After 7 days, you may progress as below.  You should remove your sling 24 hours after your procedure, unless otherwise instructed by your provider.     Friday July 27, 2021  Saturday July 28, 2021 Sunday July 29, 2021 Monday July 30, 2021   Do not lift, push, pull, or carry anything over 10 pounds with the affected arm until 6 weeks (Friday August 31, 2021 ) after your procedure.   You may drive AFTER your wound check, unless you have been told otherwise by your provider.   Ask your healthcare provider when you can go back to work   INCISION/Dressing If you are on a blood thinner such as Coumadin, Xarelto, Eliquis, Plavix, or Pradaxa please confirm with your provider when this should be resumed.   If large square, outer bandage is left in place, this can be removed after 24 hours from your procedure. Do not remove steri-strips or glue as below.   Monitor your Pacemaker site for redness, swelling, and drainage. Call the device clinic at [phone] if you experience these symptoms or fever/chills.  If your incision is sealed with Steri-strips or staples, you may shower 10 days after your procedure or when told by your provider. Do not remove the steri-strips or let the shower hit directly on your site. You may wash around your site with soap and water.    If you were discharged in a sling, please do not wear this during the day more than 48 hours after your surgery unless otherwise instructed. This may increase the risk of stiffness and soreness in your shoulder.   Avoid lotions, ointments, or perfumes over your incision until it is well-healed.  You may use a hot tub or a pool AFTER your wound check appointment if the incision is completely closed.  [employer] Alerts:  Some alerts are vibratory and others beep. These are NOT  emergencies. Please call our office to let us know. If this occurs at night or on weekends, it can wait until the next business day. Send a remote transmission.  If your device is capable of reading fluid status (for heart failure), you will be offered monthly monitoring to review this with you.   DEVICE MANAGEMENT Remote monitoring is used to monitor your pacemaker from home. This monitoring is scheduled every 91 days by our office. It allows [place] to keep an eye on the functioning of your device to ensure it is working properly. You will routinely see your Electrophysiologist annually (more often if necessary).   You should receive your ID card for your new device in 4-8 weeks. Keep this card with you at all times once received. Consider wearing a medical alert bracelet or necklace.  Your Pacemaker may be MRI compatible. This will be discussed at your next office visit/wound check.  You should avoid contact with strong electric or magnetic fields.   Do not use amateur (ham) radio equipment or electric (arc) welding torches. MP3 player headphones with magnets should not be used. Some devices are safe to use if held at least 12 inches (30 cm) from your Pacemaker. These include power tools, lawn mowers, and speakers. If you are unsure if something is safe to use, ask your health care provider.  When using your cell phone, hold it to the ear that is on the opposite side from the  Pacemaker. Do not leave your cell phone in a pocket over the Pacemaker.  You may safely use electric blankets, heating pads, computers, and microwave ovens.  Call the office right away if: You have chest pain. You feel more short of breath than you have felt before. You feel more light-headed than you have felt before. Your incision starts to open up.  This information is not intended to replace advice given to you by your health care provider. Make sure you discuss any questions you have with your health care provider.

## 2021-07-20 NOTE — Discharge Summary (Signed)
ELECTROPHYSIOLOGY PROCEDURE DISCHARGE SUMMARY    Patient ID: Alec Chew.,  MRN: [id number], DOB/AGE: [birth year] 60 y.o.  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     Procedures This Admission:  LHC 07/16/2021    1st Mrg lesion is 50% stenosed.   Prox LAD to Mid LAD lesion is 30% stenosed.   The left ventricular systolic function is normal.   LV end diastolic pressure is mildly elevated.   The left ventricular ejection fraction is 55-65% by visual estimate.  I..  Mild to moderate nonobstructive coronary artery disease. II.  Normal LV systolic function mildly elevated left ventricular end-diastolic pressure. 2. Echo 07/17/2021 - LVEF 55%, moderate thickening of the aortic valve 3. cMRI 07/18/2021 Mildly decrease LVEF. Patchy LGE with elevated ECV and T2 signal. This can be seen in both inflammatory cardiomyopathies including cardiac sarcoidosis. This is less suggestive of cardiac amyloidosis. Overread - Lacking some typical features of sarcoid, more likely prior myocarditis 4. High res chest CT 07/18/2021 i. Minimal, bland appearing, bandlike scarring of the bilateral lung bases. No evidence of pulmonary sarcoidosis or fibrotic interstitial lung disease. ii. Prominent, although not pathologically enlarged subcentimeter mediastinal and hilar lymph nodes, nonspecific and likely reactive. iii. Coronary artery disease. iv. Hepatic steatosis. 5. Implantation of a Biotronik dual chamber PPM on 07/19/2021 by Dr. Lalla Brothers.  The patient received a Biotronik model number [id number] PPM with model number [id number] right atrial lead and [phone] right ventricular lead. There were no immediate post procedure complications. 6.  CXR on 07/20/21 demonstrated no pneumothorax status post device implantation.   Brief HPI: Alec Ali. is a 60 y.o. male was admitted for symptomatic bradycardia and electrophysiology team asked to see for consideration of PPM implantation.  Past medical history includes above.  The patient has had symptomatic bradycardia without reversible causes identified.  Risks, benefits, and alternatives to PPM implantation were reviewed with the patient who wished to proceed.   Hospital Course:  The patient was admitted with symptomatic bradycardia as above. Initial work up concerning for infiltrative cardiomyopathy. Work up thus far is most consistent with prior myocarditis. Pt underwent implantation of a Biotronik dual chamber PPM with details as outlined above.  He was monitored on telemetry overnight which demonstrated appropriate pacing.  Left chest was without hematoma or ecchymosis.  The device was interrogated and found to be functioning normally.  CXR was obtained and demonstrated no pneumothorax status post device implantation.  Wound care, arm mobility, and restrictions were reviewed with the patient.  The patient was examined and considered stable for discharge to home.    BP remained somewhat elevated. Home Chlorthalidone resumed, and coreg added now with PPM in place.   Physical Exam: Vitals:   07/19/21 2318 07/20/21 0323 07/20/21 0530 07/20/21 1100  BP: (!) 145/77 (!) 144/81 (!) 157/79 (!) 142/68  Pulse: 97 89  90  Resp: 18 15  16   Temp: 98.2 F (36.8 C) 98.2 F (36.8 C)  97.9 F (36.6 C)  TempSrc: Oral Oral    SpO2: 96% 95%  95%  Weight:  107.7 kg  Height:        GEN- The patient is well appearing, alert and oriented x 3 today.   HEENT: normocephalic, atraumatic; sclera clear,  conjunctiva pink; hearing intact; oropharynx clear; neck supple, no JVP Lymph- no cervical lymphadenopathy Lungs- Clear to ausculation bilaterally, normal work of breathing.  No wheezes, rales, rhonchi Heart- Regular rate and rhythm, no murmurs, rubs or gallops, PMI not laterally displaced GI- soft, non-tender, non-distended, bowel sounds present, no hepatosplenomegaly Extremities- no clubbing, cyanosis, or edema; DP/PT/radial pulses 2+ bilaterally MS- no significant deformity or atrophy Skin- warm and dry, no rash or lesion, left chest without hematoma/ecchymosis Psych- euthymic mood, full affect Neuro- strength and sensation are intact   Labs:   Lab Results  Component Value Date   WBC 12.4 (H) 07/19/2021   HGB 12.8 (L) 07/19/2021   HCT 36.6 (L) 07/19/2021   MCV 85.7 07/19/2021   PLT 216 07/19/2021    Recent Labs  Lab 07/19/21 0031  NA 133*  K 3.5  CL 99  CO2 25  BUN 16  CREATININE 1.13  CALCIUM 8.7*  GLUCOSE 193*    Discharge Medications:  Allergies as of 07/20/2021       Reactions   Lisinopril Palpitations, Other (See Comments)   "Didn't set well" Increased heart rate, chest pain.        Medication List     STOP taking these medications    amLODipine 5 MG tablet Commonly known as: NORVASC       TAKE these medications    acetaminophen 325 MG tablet Commonly known as: TYLENOL Take 2 tablets (650 mg total) by mouth every 4 (four) hours as needed for headache or mild pain.   carvedilol 12.5 MG tablet Commonly known as: COREG Take 1 tablet (12.5 mg total) by mouth 2 (two) times daily with a meal.   chlorthalidone 25 MG tablet Commonly known as: HYGROTON Take 1 tablet (25 mg total) by mouth daily. Start taking on: July 21, 2021   ezetimibe 10 MG tablet Commonly known as: ZETIA Take 1 tablet (10 mg total) by mouth daily.   losartan 100 MG tablet Commonly known as: COZAAR Take 1 tablet (100 mg total) by mouth every evening.   magnesium  oxide 400 (240 Mg) MG tablet Commonly known as: MAG-OX Take 1 tablet (400 mg total) by mouth 2 (two) times daily.   Mens 50+ Multi Vitamin/Min Tabs Take 1 tablet by mouth daily.   pantoprazole 40 MG tablet Commonly known as: PROTONIX Take 1 tablet (40 mg total) by mouth daily.   Pepcid AC Maximum Strength 20 MG tablet Generic drug: famotidine Take 20 mg by mouth daily as needed for heartburn or indigestion.        Disposition:    Follow-up Information     [facility] Follow up.   Why: on 12/29 at 1120 am for post pacemaker check Contact information: [address] [place] [address] [phone]                Duration of Discharge Encounter: Greater than 30 minutes including physician time.  Dustin Flock, PA-C  07/20/2021 12:33 PM

## 2021-07-23 ENCOUNTER — Telehealth: Payer: Self-pay | Admitting: Internal Medicine

## 2021-07-23 MED ORDER — LOSARTAN POTASSIUM 100 MG PO TABS: 100.0000 mg | ORAL_TABLET | Freq: Every evening | ORAL | 1 refills | Status: DC

## 2021-07-23 NOTE — Telephone Encounter (Signed)
Ann from [facility] order pharmacy called in regards to pts medication. Pt is requesting losartan (COZAAR) 100 MG tablet be sent into [facility] mail order pharmacy. Pharmacy has previously submitted request by fax and have not heard anything back.

## 2021-07-23 NOTE — Telephone Encounter (Signed)
Medication has been refilled.

## 2021-08-02 ENCOUNTER — Other Ambulatory Visit: Payer: Self-pay

## 2021-08-02 ENCOUNTER — Ambulatory Visit (INDEPENDENT_AMBULATORY_CARE_PROVIDER_SITE_OTHER): Payer: PRIVATE HEALTH INSURANCE

## 2021-08-02 DIAGNOSIS — I442 Atrioventricular block, complete: Secondary | ICD-10-CM | POA: Diagnosis not present

## 2021-08-02 LAB — CUP PACEART INCLINIC DEVICE CHECK
Brady Statistic RA Percent Paced: 8 %
Brady Statistic RV Percent Paced: 98 %
Date Time Interrogation Session: 20221229150824
Implantable Lead Implant Date: 20221215
Implantable Lead Implant Date: 20221215
Implantable Lead Location: 753859
Implantable Lead Location: 753860
Implantable Lead Model: 377171
Implantable Lead Model: 377171
Implantable Lead Serial Number: 8000616455
Implantable Lead Serial Number: 8000672424
Implantable Pulse Generator Implant Date: 20221215
Lead Channel Impedance Value: 585 Ohm
Lead Channel Impedance Value: 624 Ohm
Lead Channel Pacing Threshold Amplitude: 0.6 V
Lead Channel Pacing Threshold Amplitude: 0.7 V
Lead Channel Pacing Threshold Pulse Width: 0.4 ms
Lead Channel Pacing Threshold Pulse Width: 0.4 ms
Lead Channel Sensing Intrinsic Amplitude: 12.5 mV
Lead Channel Sensing Intrinsic Amplitude: 2.8 mV
Lead Channel Setting Pacing Amplitude: 3 V
Lead Channel Setting Pacing Amplitude: 3 V
Lead Channel Setting Pacing Pulse Width: 0.4 ms
Pulse Gen Model: 407145
Pulse Gen Serial Number: 70262121

## 2021-08-02 MED ORDER — CARVEDILOL 12.5 MG PO TABS: 12.5000 mg | ORAL_TABLET | Freq: Two times a day (BID) | ORAL | 6 refills | Status: DC

## 2021-08-02 NOTE — Progress Notes (Signed)
Wound check appointment. Steri-strips removed. Wound without redness or edema. Incision edges approximated, wound well healed. Normal device function. Thresholds, sensing, and impedances consistent with implant measurements. Device programmed at 3.0V for extra safety margin until 3 month visit. Histogram distribution appropriate for patient and level of activity. No mode switches or high ventricular rates noted. Patient educated about wound care, arm mobility, lifting restrictions.  Patient is enrolled in remote monitoring, transmitting nightly, next scheduled summary 10/18/21.  ROV with Dr. Lalla Brothers on 10/24/21.

## 2021-08-02 NOTE — Patient Instructions (Signed)

## 2021-08-14 ENCOUNTER — Telehealth: Payer: Self-pay | Admitting: *Deleted

## 2021-08-14 NOTE — Telephone Encounter (Signed)
Transition Care Management Unsuccessful Follow-up Telephone Call ° °Date of discharge and from where: 07/20/21  [facility] ° °Attempts:  1st Attempt ° °Reason for unsuccessful TCM follow-up call:  No answer/busy ° °Makenleigh Crownover RNC, BSN °RN Case Manager °[phone] ° ° °  °

## 2021-08-20 ENCOUNTER — Telehealth: Payer: Self-pay | Admitting: *Deleted

## 2021-08-20 NOTE — Telephone Encounter (Signed)
Transition Care Management Unsuccessful Follow-up Telephone Call ° °Date of discharge and from where: 07/20/21  [facility] ° °Attempts:  2nd Attempt ° °Reason for unsuccessful TCM follow-up call:  No answer/busy ° ° °Wilhelmine Krogstad RNC, BSN °RN Case Manager °[phone] ° ° °  °

## 2021-08-31 ENCOUNTER — Encounter: Payer: Self-pay | Admitting: Cardiology

## 2021-09-25 ENCOUNTER — Telehealth: Payer: Self-pay | Admitting: Internal Medicine

## 2021-09-25 MED ORDER — EZETIMIBE 10 MG PO TABS: 10.0000 mg | ORAL_TABLET | Freq: Every day | ORAL | 0 refills | Status: DC

## 2021-09-25 MED ORDER — CHLORTHALIDONE 25 MG PO TABS: 25.0000 mg | ORAL_TABLET | Freq: Every day | ORAL | 0 refills | Status: DC

## 2021-09-25 MED ORDER — PANTOPRAZOLE SODIUM 40 MG PO TBEC: 40.0000 mg | DELAYED_RELEASE_TABLET | Freq: Every day | ORAL | 0 refills | Status: DC

## 2021-09-25 NOTE — Telephone Encounter (Signed)
Medications  been refilled for 90 days only. Pt is due for a follow up with Dr. Derrel Nip. LMTCB to call back to schedule a follow up appt.

## 2021-09-25 NOTE — Telephone Encounter (Signed)
Cassandra from [facility] order pharmacy calling about refills pantoprazole, chlorthalidone,  ezetimibe, and metoprolol succinate

## 2021-10-18 ENCOUNTER — Ambulatory Visit (INDEPENDENT_AMBULATORY_CARE_PROVIDER_SITE_OTHER): Payer: PRIVATE HEALTH INSURANCE

## 2021-10-18 DIAGNOSIS — I442 Atrioventricular block, complete: Secondary | ICD-10-CM

## 2021-10-18 LAB — CUP PACEART REMOTE DEVICE CHECK
Battery Remaining Percentage: 100 %
Brady Statistic RA Percent Paced: 25 %
Brady Statistic RV Percent Paced: 98 %
Date Time Interrogation Session: 20230315072435
Implantable Lead Implant Date: 20221215
Implantable Lead Implant Date: 20221215
Implantable Lead Location: 753859
Implantable Lead Location: 753860
Implantable Lead Model: 377171
Implantable Lead Model: 377171
Implantable Lead Serial Number: 8000616455
Implantable Lead Serial Number: 8000672424
Implantable Pulse Generator Implant Date: 20221215
Lead Channel Impedance Value: 546 Ohm
Lead Channel Impedance Value: 585 Ohm
Lead Channel Pacing Threshold Amplitude: 0.8 V
Lead Channel Pacing Threshold Amplitude: 1.2 V
Lead Channel Pacing Threshold Pulse Width: 0.4 ms
Lead Channel Pacing Threshold Pulse Width: 0.4 ms
Lead Channel Sensing Intrinsic Amplitude: 0.9 mV
Lead Channel Sensing Intrinsic Amplitude: 6.5 mV
Lead Channel Setting Pacing Amplitude: 3 V
Lead Channel Setting Pacing Amplitude: 3 V
Lead Channel Setting Pacing Pulse Width: 0.4 ms
Pulse Gen Model: 407145
Pulse Gen Serial Number: 70262121

## 2021-10-24 ENCOUNTER — Encounter: Payer: Self-pay | Admitting: Cardiology

## 2021-10-24 ENCOUNTER — Other Ambulatory Visit: Payer: Self-pay

## 2021-10-24 ENCOUNTER — Ambulatory Visit (INDEPENDENT_AMBULATORY_CARE_PROVIDER_SITE_OTHER): Payer: PRIVATE HEALTH INSURANCE | Admitting: Cardiology

## 2021-10-24 VITALS — BP 164/72 | HR 64 | Ht 71.0 in | Wt 239.0 lb

## 2021-10-24 DIAGNOSIS — I442 Atrioventricular block, complete: Secondary | ICD-10-CM | POA: Diagnosis not present

## 2021-10-24 DIAGNOSIS — Z950 Presence of cardiac pacemaker: Secondary | ICD-10-CM

## 2021-10-24 DIAGNOSIS — I10 Essential (primary) hypertension: Secondary | ICD-10-CM | POA: Diagnosis not present

## 2021-10-24 LAB — PACEMAKER DEVICE OBSERVATION

## 2021-10-24 MED ORDER — NIFEDIPINE ER OSMOTIC RELEASE 30 MG PO TB24: 30.0000 mg | ORAL_TABLET | Freq: Every day | ORAL | 3 refills | Status: DC

## 2021-10-24 NOTE — Patient Instructions (Signed)
Medications: ?Stop Mag ?Start Procardia 30 mg daily ?Your physician recommends that you continue on your current medications as directed. Please refer to the Current Medication list given to you today. ?*If you need a refill on your cardiac medications before your next appointment, please call your pharmacy* ? ?Lab Work: ?None. ?If you have labs (blood work) drawn today and your tests are completely normal, you will receive your results only by: ?MyChart Message (if you have MyChart) OR ?A paper copy in the mail ?If you have any lab test that is abnormal or we need to change your treatment, we will call you to review the results. ? ?Testing/Procedures: ?None. ? ?Follow-Up: ?At [facility], you and your health needs are our priority.  As part of our continuing mission to provide you with exceptional heart care, we have created designated Provider Care Teams.  These Care Teams include your primary Cardiologist (physician) and Advanced Practice Providers (APPs -  Physician Assistants and Nurse Practitioners) who all work together to provide you with the care you need, when you need it. ? ?Your physician wants you to follow-up in: 12 months with Steffanie Dunn or one of the following Advanced Practice Providers on your designated Care Team:   ? ?Ward Givens, NP ?Eula Listen PA ?Cadence Fransico Michael PA ? ?  You will receive a reminder letter in the mail two months in advance. If you don't receive a letter, please call our office to schedule the follow-up appointment. ? ?We recommend signing up for the patient portal called "MyChart".  Sign up information is provided on this After Visit Summary.  MyChart is used to connect with patients for Virtual Visits (Telemedicine).  Patients are able to view lab/test results, encounter notes, upcoming appointments, etc.  Non-urgent messages can be sent to your provider as well.   ?To learn more about what you can do with MyChart, go to [email or web address].   ? ?Any Other Special  Instructions Will Be Listed Below (If Applicable).  ?

## 2021-10-24 NOTE — Progress Notes (Signed)
?Electrophysiology Office Follow up Visit Note:   ? ?Date:  10/24/2021  ? ?ID:  Alec Rutter., DOB Oct 26, 1960, MRN [id number] ? ?PCP:  Crecencio Mc, MD  ?[facility] Cardiologist:  Ida Rogue, MD  ?[facility] Electrophysiologist:  Vickie Epley, MD  ? ? ?Interval History:   ? ?Alec Ray Eluzer Wares. is a 61 y.o. male who presents for a follow up visit.  He had a dual-chamber permanent pacemaker implanted July 19, 2021 for complete heart block.  Device has been functioning appropriately.  Incision is healed well.  He checks his blood pressures at home and they are routinely above [id number] systolic.  They even get up into the 123456 and XX123456 systolic. ? ? ?  ? ?Past Medical History:  ?Diagnosis Date  ? GERD (gastroesophageal reflux disease)   ? EGD normal 08/08/2011 [place]   ? Hyperlipidemia   ? Hypertension   ? Nonobstructive CAD (coronary artery disease)   ? a. 07/2021 Cath: LM nl, LAD 30p/m, LCX mild diff dzs, OM1 50, RCA min irregs, RPDA/RPAV min irregs. EF 55-65%.  ? ? ?Past Surgical History:  ?Procedure Laterality Date  ? INGUINAL HERNIA REPAIR  1987  ? bilateral  ? LEFT HEART CATH AND CORONARY ANGIOGRAPHY N/A 07/16/2021  ? Procedure: LEFT HEART CATH AND CORONARY ANGIOGRAPHY;  Surgeon: Wellington Hampshire, MD;  Location: [facility];  Service: Cardiovascular;  Laterality: N/A;  ? PACEMAKER IMPLANT N/A 07/19/2021  ? Procedure: PACEMAKER IMPLANT;  Surgeon: Vickie Epley, MD;  Location: [facility];  Service: Cardiovascular;  Laterality: N/A;  ? TONSILLECTOMY    ? ? ?Current Medications: ?Current Meds  ?Medication Sig  ? acetaminophen (TYLENOL) 325 MG tablet Take 2 tablets (650 mg total) by mouth every 4 (four) hours as needed for headache or mild pain.  ? carvedilol (COREG) 12.5 MG tablet Take 1 tablet (12.5 mg total) by mouth 2 (two) times daily with a meal.  ? chlorthalidone (HYGROTON) 25 MG tablet Take 1 tablet (25 mg total) by mouth daily.  ? ezetimibe (ZETIA) 10 MG tablet Take  1 tablet (10 mg total) by mouth daily.  ? famotidine (PEPCID) 20 MG tablet Take 20 mg by mouth daily as needed for heartburn or indigestion.  ? losartan (COZAAR) 100 MG tablet Take 1 tablet (100 mg total) by mouth every evening.  ? Multiple Vitamins-Minerals (MENS 50+ MULTI VITAMIN/MIN) TABS Take 1 tablet by mouth daily.  ? NIFEdipine (PROCARDIA XL) 30 MG 24 hr tablet Take 1 tablet (30 mg total) by mouth daily.  ? pantoprazole (PROTONIX) 40 MG tablet Take 1 tablet (40 mg total) by mouth daily.  ? [DISCONTINUED] magnesium oxide (MAG-OX) 400 (240 Mg) MG tablet Take 1 tablet (400 mg total) by mouth 2 (two) times daily.  ?  ? ?Allergies:   Hydralazine hcl, Lisinopril, and Norvasc [amlodipine besylate]  ? ?Social History  ? ?Socioeconomic History  ? Marital status: Married  ?  Spouse name: Not on file  ? Number of children: Not on file  ? Years of education: Not on file  ? Highest education level: Not on file  ?Occupational History  ? Occupation: CONSTRUCTION  ?  Employer: SELF EMPLOYED  ?  Comment: Full time  ?Tobacco Use  ? Smoking status: Former  ? Smokeless tobacco: Former  ?  Types: Snuff  ?Substance and Sexual Activity  ? Alcohol use: Yes  ?  Comment: Occasional  ? Drug use: No  ? Sexual activity: Yes  ?  Other Topics Concern  ? Not on file  ?Social History Narrative  ? Married  ? Does not get regular exercise  ? ?Social Determinants of Health  ? ?Financial Resource Strain: Not on file  ?Food Insecurity: Not on file  ?Transportation Needs: Not on file  ?Physical Activity: Not on file  ?Stress: Not on file  ?Social Connections: Not on file  ?  ? ?Family History: ?The patient's family history includes Breast cancer in his mother; COPD in his father; Colon cancer (age of onset: 69) in his maternal grandmother; Diabetes in his mother; Hypertension in his father; Thyroid disease in his mother. ? ?ROS:   ?Please see the history of present illness.    ?All other systems reviewed and are negative. ? ?EKGs/Labs/Other Studies  Reviewed:   ? ?The following studies were reviewed today: ? ?October 24, 2021 in clinic device interrogation personally reviewed ?Battery longevity 8 years ?Lead parameter stable ?Ventricular pacing 90% ?Turned on capture control during today's appointment ? ? ?EKG:  The ekg ordered today demonstrates a sensed, V pacing, single PVC ? ?Recent Labs: ?11/02/2020: ALT 35 ?07/14/2021: TSH 4.692 ?07/19/2021: BUN 16; Creatinine, Ser 1.13; Hemoglobin 12.8; Magnesium 1.9; Platelets 216; Potassium 3.5; Sodium 133  ?Recent Lipid Panel ?   ?Component Value Date/Time  ? CHOL 172 07/05/2020 1150  ? TRIG 256.0 (H) 07/05/2020 1150  ? HDL 36.90 (L) 07/05/2020 1150  ? CHOLHDL 5 07/05/2020 1150  ? VLDL 51.2 (H) 07/05/2020 1150  ? LDLCALC 120 (H) 01/09/2015 1146  ? LDLDIRECT 86.0 07/05/2020 1150  ? ? ?Physical Exam:   ? ?VS:  BP (!) 164/72 (BP Location: Left Arm, Patient Position: Sitting, Cuff Size: Normal)   Pulse 64   Ht 5\' 11"  (1.803 m)   Wt 239 lb (108.4 kg)   SpO2 97%   BMI 33.33 kg/m?    ? ?Wt Readings from Last 3 Encounters:  ?10/24/21 239 lb (108.4 kg)  ?07/20/21 237 lb 7 oz (107.7 kg)  ?07/16/21 240 lb (108.9 kg)  ?  ? ?GEN: Well nourished, well developed in no acute distress ?HEENT: Normal ?NECK: No JVD; No carotid bruits ?LYMPHATICS: No lymphadenopathy ?CARDIAC: RRR, no murmurs, rubs, gallops.  Pacemaker pocket well-healed ?RESPIRATORY:  Clear to auscultation without rales, wheezing or rhonchi  ?ABDOMEN: Soft, non-tender, non-distended ?MUSCULOSKELETAL:  No edema; No deformity  ?SKIN: Warm and dry ?NEUROLOGIC:  Alert and oriented x 3 ?PSYCHIATRIC:  Normal affect  ? ? ? ?  ? ?ASSESSMENT:   ? ?1. Essential hypertension   ?2. Complete heart block ([place])   ?3. Pacemaker   ? ?PLAN:   ? ?In order of problems listed above: ? ? ?#Complete heart block ?#Pacemaker in situ ?Device functioning appropriately.  Continue remote monitoring. ? ?#Hypertension ?Blood pressures are now controlled.  Continue chlorthalidone, losartan and Coreg.   I will add nifedipine and refer him to the hypertension clinic for further medication regimen optimization. ? ?Follow-up with me in 1 year or sooner as needed. ? ? ?Medication Adjustments/Labs and Tests Ordered: ?Current medicines are reviewed at length with the patient today.  Concerns regarding medicines are outlined above.  ?Orders Placed This Encounter  ?Procedures  ? Amb Referral to Clinical Pharmacist  ? EKG 12-Lead  ? ?Meds ordered this encounter  ?Medications  ? NIFEdipine (PROCARDIA XL) 30 MG 24 hr tablet  ?  Sig: Take 1 tablet (30 mg total) by mouth daily.  ?  Dispense:  90 tablet  ?  Refill:  3  ? ? ? ?  Signed, ?Alec Mage, MD, [facility], [facility] ?10/24/2021 9:46 PM    ?Electrophysiology ?[facility] ?

## 2021-10-26 NOTE — Progress Notes (Signed)
Remote pacemaker transmission.   

## 2021-10-31 NOTE — Addendum Note (Signed)
Addended by: Sampson Goon on: 10/31/2021 05:44 PM ? ? Modules accepted: Orders ? ?

## 2021-12-13 ENCOUNTER — Other Ambulatory Visit: Payer: Self-pay | Admitting: Internal Medicine

## 2021-12-16 ENCOUNTER — Other Ambulatory Visit: Payer: Self-pay | Admitting: Cardiology

## 2021-12-24 ENCOUNTER — Other Ambulatory Visit: Payer: Self-pay

## 2021-12-24 NOTE — Telephone Encounter (Signed)
Please advise if ok to refill medications. Last filled by PCP.

## 2021-12-26 ENCOUNTER — Telehealth: Payer: Self-pay | Admitting: Cardiovascular Disease

## 2021-12-26 NOTE — Telephone Encounter (Signed)
*  STAT* If patient is at the pharmacy, call can be transferred to refill team.   1. Which medications need to be refilled? (please list name of each medication and dose if known) losartan (COZAAR) 100 MG tablet chlorthalidone (HYGROTON) 25 MG tablet  2. Which pharmacy/location (including street and city if local pharmacy) is medication to be sent to? [facility] - [place], [place] - [address]  3. Do they need a 30 day or 90 day supply? 90  Jessica from [facility] states that they put in for several refill orders and received all, except these two medications and needs them called in again.

## 2021-12-26 NOTE — Telephone Encounter (Signed)
Spoke with Marina Goodell at [facility] who obtained refills from PCP.

## 2022-01-01 ENCOUNTER — Telehealth: Payer: Self-pay | Admitting: Cardiology

## 2022-01-01 ENCOUNTER — Encounter: Payer: Self-pay | Admitting: Cardiology

## 2022-01-01 NOTE — Telephone Encounter (Signed)
Pt c/o medication issue:  1. Name of Medication:   carvedilol (COREG) 12.5 MG tablet    chlorthalidone (HYGROTON) 25 MG tablet    losartan (COZAAR) 100 MG tablet   2. How are you currently taking this medication (dosage and times per day)?   3. Are you having a reaction (difficulty breathing--STAT)? No  4. What is your medication issue? Pt's wife states that Dr. Quentin Ore agreed to over see the above medications. Wife states that pharmacy will not refill due to medication coming up under pt's PCP. Pt's wife would like a callback regarding this matter. Please advise   *STAT* If patient is at the pharmacy, call can be transferred to refill team.   1. Which medications need to be refilled? (please list name of each medication and dose if known)   carvedilol (COREG) 12.5 MG tablet    chlorthalidone (HYGROTON) 25 MG tablet    losartan (COZAAR) 100 MG tablet   2. Which pharmacy/location (including street and city if local pharmacy) is medication to be sent to?  [place], [place]. Do they need a 30 day or 90 day supply?  30 day

## 2022-01-01 NOTE — Telephone Encounter (Signed)
This is a [place] pt 

## 2022-01-02 ENCOUNTER — Telehealth: Payer: Self-pay

## 2022-01-02 MED ORDER — LOSARTAN POTASSIUM 100 MG PO TABS: 100.0000 mg | ORAL_TABLET | Freq: Every evening | ORAL | 3 refills | Status: DC

## 2022-01-02 MED ORDER — CARVEDILOL 12.5 MG PO TABS: 12.5000 mg | ORAL_TABLET | Freq: Two times a day (BID) | ORAL | 3 refills | Status: DC

## 2022-01-02 MED ORDER — CHLORTHALIDONE 25 MG PO TABS: 25.0000 mg | ORAL_TABLET | Freq: Every day | ORAL | 3 refills | Status: DC

## 2022-01-02 NOTE — Telephone Encounter (Signed)
Received medication refill request for Losartan  but pt has not been seen since 10/2019. LMTCB. Please scheduled we he calls back.

## 2022-01-02 NOTE — Telephone Encounter (Signed)
Notified patient's wife (DPR) that prescriptions were sent in.  Verbalized understanding.

## 2022-01-02 NOTE — Telephone Encounter (Signed)
See phone call encounter.

## 2022-01-02 NOTE — Telephone Encounter (Signed)
Pt would like for Dr. Lalla Brothers to handle refill request for the following medications listed. Please advise if ok to refill for future refills.

## 2022-01-03 NOTE — Telephone Encounter (Signed)
Attempted to call pt. Mail box full.  °

## 2022-01-17 ENCOUNTER — Ambulatory Visit (INDEPENDENT_AMBULATORY_CARE_PROVIDER_SITE_OTHER): Payer: PRIVATE HEALTH INSURANCE

## 2022-01-17 DIAGNOSIS — I442 Atrioventricular block, complete: Secondary | ICD-10-CM | POA: Diagnosis not present

## 2022-01-17 LAB — CUP PACEART REMOTE DEVICE CHECK
Battery Remaining Percentage: 95 %
Brady Statistic RA Percent Paced: 21 %
Brady Statistic RV Percent Paced: 99 %
Date Time Interrogation Session: 20230615105955
Implantable Lead Implant Date: 20221215
Implantable Lead Implant Date: 20221215
Implantable Lead Location: 753859
Implantable Lead Location: 753860
Implantable Lead Model: 377171
Implantable Lead Model: 377171
Implantable Lead Serial Number: 8000616455
Implantable Lead Serial Number: 8000672424
Implantable Pulse Generator Implant Date: 20221215
Lead Channel Impedance Value: 527 Ohm
Lead Channel Impedance Value: 585 Ohm
Lead Channel Pacing Threshold Amplitude: 1 V
Lead Channel Pacing Threshold Pulse Width: 0.4 ms
Lead Channel Sensing Intrinsic Amplitude: 0.9 mV
Lead Channel Sensing Intrinsic Amplitude: 6.8 mV
Lead Channel Setting Pacing Amplitude: 3 V
Lead Channel Setting Pacing Amplitude: 3 V
Lead Channel Setting Pacing Pulse Width: 0.4 ms
Pulse Gen Model: 407145
Pulse Gen Serial Number: 70262121

## 2022-04-18 ENCOUNTER — Ambulatory Visit (INDEPENDENT_AMBULATORY_CARE_PROVIDER_SITE_OTHER): Payer: PRIVATE HEALTH INSURANCE

## 2022-04-18 DIAGNOSIS — I442 Atrioventricular block, complete: Secondary | ICD-10-CM | POA: Diagnosis not present

## 2022-04-18 LAB — CUP PACEART REMOTE DEVICE CHECK
Battery Remaining Percentage: 95 %
Brady Statistic RA Percent Paced: 24 %
Brady Statistic RV Percent Paced: 98 %
Date Time Interrogation Session: 20230913102432
Implantable Lead Implant Date: 20221215
Implantable Lead Implant Date: 20221215
Implantable Lead Location: 753859
Implantable Lead Location: 753860
Implantable Lead Model: 377171
Implantable Lead Model: 377171
Implantable Lead Serial Number: 8000616455
Implantable Lead Serial Number: 8000672424
Implantable Pulse Generator Implant Date: 20221215
Lead Channel Impedance Value: 546 Ohm
Lead Channel Impedance Value: 585 Ohm
Lead Channel Pacing Threshold Amplitude: 1.1 V
Lead Channel Pacing Threshold Pulse Width: 0.4 ms
Lead Channel Sensing Intrinsic Amplitude: 0.7 mV
Lead Channel Sensing Intrinsic Amplitude: 7.7 mV
Lead Channel Setting Pacing Amplitude: 3 V
Lead Channel Setting Pacing Amplitude: 3 V
Lead Channel Setting Pacing Pulse Width: 0.4 ms
Pulse Gen Model: 407145
Pulse Gen Serial Number: 70262121

## 2022-05-01 NOTE — Progress Notes (Signed)
Remote pacemaker transmission.   

## 2022-07-18 ENCOUNTER — Ambulatory Visit (INDEPENDENT_AMBULATORY_CARE_PROVIDER_SITE_OTHER): Payer: PRIVATE HEALTH INSURANCE

## 2022-07-18 DIAGNOSIS — I442 Atrioventricular block, complete: Secondary | ICD-10-CM | POA: Diagnosis not present

## 2022-07-18 LAB — CUP PACEART REMOTE DEVICE CHECK
Date Time Interrogation Session: 20231214075556
Implantable Lead Connection Status: 753985
Implantable Lead Connection Status: 753985
Implantable Lead Implant Date: 20221215
Implantable Lead Implant Date: 20221215
Implantable Lead Location: 753859
Implantable Lead Location: 753860
Implantable Lead Model: 377171
Implantable Lead Model: 377171
Implantable Lead Serial Number: 8000616455
Implantable Lead Serial Number: 8000672424
Implantable Pulse Generator Implant Date: 20221215
Pulse Gen Model: 407145
Pulse Gen Serial Number: 70262121

## 2022-08-08 NOTE — Progress Notes (Signed)
Remote pacemaker transmission.   

## 2022-10-17 ENCOUNTER — Ambulatory Visit: Payer: PRIVATE HEALTH INSURANCE

## 2022-10-17 DIAGNOSIS — I442 Atrioventricular block, complete: Secondary | ICD-10-CM

## 2022-10-17 LAB — CUP PACEART REMOTE DEVICE CHECK
Battery Remaining Percentage: 90 %
Brady Statistic RA Percent Paced: 27 %
Brady Statistic RV Percent Paced: 98 %
Date Time Interrogation Session: 20240314072143
Implantable Lead Connection Status: 753985
Implantable Lead Connection Status: 753985
Implantable Lead Implant Date: 20221215
Implantable Lead Implant Date: 20221215
Implantable Lead Location: 753859
Implantable Lead Location: 753860
Implantable Lead Model: 377171
Implantable Lead Model: 377171
Implantable Lead Serial Number: 8000616455
Implantable Lead Serial Number: 8000672424
Implantable Pulse Generator Implant Date: 20221215
Lead Channel Impedance Value: 546 Ohm
Lead Channel Impedance Value: 566 Ohm
Lead Channel Pacing Threshold Amplitude: 1 V
Lead Channel Pacing Threshold Pulse Width: 0.4 ms
Lead Channel Sensing Intrinsic Amplitude: 0.7 mV
Lead Channel Sensing Intrinsic Amplitude: 8.9 mV
Lead Channel Setting Pacing Amplitude: 3 V
Lead Channel Setting Pacing Amplitude: 3 V
Lead Channel Setting Pacing Pulse Width: 0.4 ms
Pulse Gen Model: 407145
Pulse Gen Serial Number: 70262121

## 2022-11-19 NOTE — Progress Notes (Signed)
Remote pacemaker transmission.   

## 2022-12-19 ENCOUNTER — Other Ambulatory Visit: Payer: Self-pay | Admitting: *Deleted

## 2022-12-19 MED ORDER — CHLORTHALIDONE 25 MG PO TABS: 25.0000 mg | ORAL_TABLET | Freq: Every day | ORAL | 0 refills | Status: DC

## 2022-12-23 ENCOUNTER — Other Ambulatory Visit: Payer: Self-pay | Admitting: Cardiology

## 2023-01-16 ENCOUNTER — Ambulatory Visit (INDEPENDENT_AMBULATORY_CARE_PROVIDER_SITE_OTHER): Payer: PRIVATE HEALTH INSURANCE

## 2023-01-16 DIAGNOSIS — I442 Atrioventricular block, complete: Secondary | ICD-10-CM | POA: Diagnosis not present

## 2023-01-17 ENCOUNTER — Encounter: Payer: PRIVATE HEALTH INSURANCE | Admitting: Cardiology

## 2023-01-18 NOTE — Progress Notes (Unsigned)
Cardiology Office Note Date:  01/20/2023  Patient ID:  Alec Livengood., DOB 1960/10/31, MRN [id number] PCP:  Patient, No Pcp Per  Cardiologist:  Julien Nordmann, MD Electrophysiologist: Lanier Prude, MD    Chief Complaint: 1 year PPM follow-up, past due  History of Present Illness: Alec Ray Sadrac Salata. is a 62 y.o. male with PMH notable for CHB s/p PPM, HTN; seen today for Lanier Prude, MD for routine electrophysiology followup.  He last saw Dr. Lalla Brothers 10/2021 for routine 91d post-PPM placement. HTN in office.  Today, he has no complaints. He did not tolerate nifedipine that Dr. Lalla Brothers started for BP, it made him jittery feeling, so he stopped it.  He has not been taking his coreg BID.  He does not check BP regularly, ~1/month. Most readings are 130-150/60-80s.  He was able to go elk hunting in [place] this past year.    he denies chest pain, palpitations, dyspnea, PND, orthopnea, nausea, vomiting, dizziness, syncope, edema, weight gain, or early satiety.    Device Information: Biotronik dual chamber PPM, imp 07/2021; dx CHB  Past Medical History:  Diagnosis Date   GERD (gastroesophageal reflux disease)    EGD normal 08/08/2011 Oh    Hyperlipidemia    Hypertension    Nonobstructive CAD (coronary artery disease)    a. 07/2021 Cath: LM nl, LAD 30p/m, LCX mild diff dzs, OM1 50, RCA min irregs, RPDA/RPAV min irregs. EF 55-65%.    Past Surgical History:  Procedure Laterality Date   INGUINAL HERNIA REPAIR  1987   bilateral   LEFT HEART CATH AND CORONARY ANGIOGRAPHY N/A 07/16/2021   Procedure: LEFT HEART CATH AND CORONARY ANGIOGRAPHY;  Surgeon: Iran Ouch, MD;  Location: [facility] INVASIVE CV LAB;  Service: Cardiovascular;  Laterality: N/A;   PACEMAKER IMPLANT N/A 07/19/2021   Procedure: PACEMAKER IMPLANT;  Surgeon: Lanier Prude, MD;  Location: [facility] INVASIVE CV LAB;  Service: Cardiovascular;  Laterality: N/A;   TONSILLECTOMY      Current Outpatient  Medications  Medication Instructions   carvedilol (COREG) 12.5 mg, Oral, 2 times daily with meals   chlorthalidone (HYGROTON) 25 mg, Oral, Daily, Keep your appointment in June for future refills.   famotidine (PEPCID) 20 mg, Oral, Daily PRN   losartan (COZAAR) 100 mg, Oral, Every evening   Multiple Vitamins-Minerals (MENS 50+ MULTI VITAMIN/MIN) TABS 1 tablet, Oral, Daily    Social History:  The patient  reports that he has quit smoking. He has quit using smokeless tobacco.  His smokeless tobacco use included snuff. He reports current alcohol use. He reports that he does not use drugs.   Family History:   The patient's family history includes Breast cancer in his mother; COPD in his father; Colon cancer (age of onset: 15) in his maternal grandmother; Diabetes in his mother; Hypertension in his father; Thyroid disease in his mother.  ROS:  Please see the history of present illness. All other systems are reviewed and otherwise negative.   PHYSICAL EXAM:  Vitals:   01/20/23 1418 01/20/23 1537  BP: (!) 160/96 (!) 164/84  Pulse: 71   Height: 5\' 11"  (1.803 m)   Weight: 240 lb (108.9 kg)   SpO2: 98%   BMI (Calculated): 33.49      GEN- The patient is well appearing, alert and oriented x 3 today.   Lungs- Clear to ausculation bilaterally, normal work of breathing.  Heart- Irregularly irregular rate and rhythm, no murmurs, rubs or gallops Extremities- No peripheral edema, warm,  dry Skin-   device pocket well-healed, no tethering   Device interrogation done today and reviewed by myself:  Battery 9+ years Lead thresholds, impedence, sensing stable  No episodes No changes made today  EKG is ordered. Personal review of EKG from today shows: AS-VP with frequent PVCs, rate 71bpm  Recent Labs: No results found for requested labs within last 365 days.  No results found for requested labs within last 365 days.   CrCl cannot be calculated (Patient's most recent lab result is older than the  maximum 21 days allowed.).   Wt Readings from Last 3 Encounters:  01/20/23 240 lb (108.9 kg)  10/24/21 239 lb (108.4 kg)  07/20/21 237 lb 7 oz (107.7 kg)     Additional studies reviewed include: Previous EP, cardiology notes.   Cardiac MRI, 07/18/2021 Mildly decrease LVEF.  Patchy LGE with elevated ECV and T2 signal. This can be seen in both inflammatory cardiomyopathies including cardiac sarcoidosis. This is less suggestive of cardiac amyloidosis.  TTE, 07/17/2021 1. Left ventricular ejection fraction, by estimation, is >55%. The left ventricle has normal function. Left ventricular endocardial border not optimally defined to evaluate regional wall motion. Left ventricular diastolic parameters are indeterminate.   2. Right ventricular systolic function is normal. The right ventricular size is normal.   3. The mitral valve is grossly normal. Trivial mitral valve regurgitation. No evidence of mitral stenosis.   4. The aortic valve has an indeterminant number of cusps. There is mild calcification of the aortic valve. There is moderate thickening of the aortic valve. Aortic valve regurgitation is not visualized. Aortic valve sclerosis/calcification is present, without any evidence of aortic stenosis.   5. The inferior vena cava is normal in size with greater than 50% respiratory variability, suggesting right atrial pressure of 3 mmHg.    ASSESSMENT AND PLAN:  #) CHB s/p PPM Device functioning well, see paceart for details Wide QRS  #) PVCs No palpitations or concerning symptoms Low, 1% burden by device Recommended patient increase coreg to BID dosing, discussed ways to help remember PM dose Update BMET and Mag - consider update zio at follow-up if continue to have elevated burden on EKG, but low on PPM  #) HTN Quite elevated today in office He has intolerances to many medications Increase coreg as above Recommended he check BP 2-3 times per week and record measurements Refer to  HTN clinic for further eval, treatment   Current medicines are reviewed at length with the patient today.   The patient does not have concerns regarding his medicines.  The following changes were made today:   INCREASE 12.5mg  coreg to BID  Labs/ tests ordered today include:  Orders Placed This Encounter  Procedures   Basic Metabolic Panel (BMET)   Magnesium   AMB REFERRAL TO [facility]ED [facility]N CLINIC   EKG 12-Lead     Disposition: Follow up with Dr. Lalla Brothers or EP APP in 6 months   Signed, Sherie Don, NP  01/20/23  3:47 PM  Electrophysiology [facility]

## 2023-01-20 ENCOUNTER — Other Ambulatory Visit
Admission: RE | Admit: 2023-01-20 | Discharge: 2023-01-20 | Disposition: A | Discharge status: Home / Self Care | Payer: PRIVATE HEALTH INSURANCE | Source: Ambulatory Visit | Attending: Cardiology | Admitting: Cardiology

## 2023-01-20 ENCOUNTER — Ambulatory Visit: Payer: PRIVATE HEALTH INSURANCE | Attending: Cardiology | Admitting: Cardiology

## 2023-01-20 ENCOUNTER — Encounter: Payer: Self-pay | Admitting: Cardiology

## 2023-01-20 VITALS — BP 164/84 | HR 71 | Ht 71.0 in | Wt 240.0 lb

## 2023-01-20 DIAGNOSIS — I493 Ventricular premature depolarization: Secondary | ICD-10-CM

## 2023-01-20 DIAGNOSIS — Z950 Presence of cardiac pacemaker: Secondary | ICD-10-CM

## 2023-01-20 DIAGNOSIS — I442 Atrioventricular block, complete: Secondary | ICD-10-CM | POA: Insufficient documentation

## 2023-01-20 DIAGNOSIS — I10 Essential (primary) hypertension: Secondary | ICD-10-CM | POA: Diagnosis present

## 2023-01-20 LAB — BASIC METABOLIC PANEL
Anion gap: 10 (ref 5–15)
BUN: 16 mg/dL (ref 6–20)
CO2: 25 mmol/L (ref 22–32)
Calcium: 9.2 mg/dL (ref 8.9–10.3)
Chloride: 100 mmol/L (ref 98–111)
Creatinine, Ser: 0.75 mg/dL (ref 0.61–1.24)
GFR, Estimated: >60 mL/min (ref >60)
Glucose, Bld: 118 mg/dL — ABNORMAL HIGH (ref 70–99)
Potassium: 3.2 mmol/L — ABNORMAL LOW (ref 3.5–5.1)
Sodium: 135 mmol/L (ref 135–145)

## 2023-01-20 LAB — CUP PACEART INCLINIC DEVICE CHECK
Date Time Interrogation Session: 20240617163644
Implantable Lead Connection Status: 753985
Implantable Lead Connection Status: 753985
Implantable Lead Implant Date: 20221215
Implantable Lead Implant Date: 20221215
Implantable Lead Location: 753859
Implantable Lead Location: 753860
Implantable Lead Model: 377171
Implantable Lead Model: 377171
Implantable Lead Serial Number: 8000616455
Implantable Lead Serial Number: 8000672424
Implantable Pulse Generator Implant Date: 20221215
Pulse Gen Model: 407145
Pulse Gen Serial Number: 70262121

## 2023-01-20 LAB — MAGNESIUM: Magnesium: 2 mg/dL (ref 1.7–2.4)

## 2023-01-20 MED ORDER — POTASSIUM CHLORIDE CRYS ER 20 MEQ PO TBCR: 20.0000 meq | EXTENDED_RELEASE_TABLET | Freq: Every day | ORAL | 1 refills | Status: DC

## 2023-01-20 NOTE — Patient Instructions (Addendum)
Medication Instructions:  TAKE carvedilol 12.5 mg by mouth twice a day   *If you need a refill on your cardiac medications before your next appointment, please call your pharmacy*  Lab Work: BMP and Magnesium level - Please go to the [facility]. You will check in at the front desk to the right as you walk into the atrium. Valet Parking is offered if needed. - No appointment needed. You may go any day between 7 am and 6 pm.  If you have labs (blood work) drawn today and your tests are completely normal, you will receive your results only by: MyChart Message (if you have MyChart) OR A paper copy in the mail If you have any lab test that is abnormal or we need to change your treatment, we will call you to review the results.   Testing/Procedures: No testing ordered  Follow-Up: At [facility], you and your health needs are our priority.  As part of our continuing mission to provide you with exceptional heart care, we have created designated Provider Care Teams.  These Care Teams include your primary Cardiologist (physician) and Advanced Practice Providers (APPs -  Physician Assistants and Nurse Practitioners) who all work together to provide you with the care you need, when you need it.  We recommend signing up for the patient portal called "MyChart".  Sign up information is provided on this After Visit Summary.  MyChart is used to connect with patients for Virtual Visits (Telemedicine).  Patients are able to view lab/test results, encounter notes, upcoming appointments, etc.  Non-urgent messages can be sent to your provider as well.   To learn more about what you can do with MyChart, go to [email or web address].    Your next appointment:   6 month(s)  Provider:   Steffanie Dunn, MD or Sherie Don, NP   Other Instructions AMB REFERRAL TO ADVANCED HTN CLINIC  You are being referred for evaluation at the [facility] in [place]. Someone from their office  will contact you to schedule an appointment.

## 2023-01-20 NOTE — Addendum Note (Signed)
Addended by: Thayer Headings, Sahira Cataldi L on: 01/20/2023 04:18 PM   Modules accepted: Orders

## 2023-01-22 ENCOUNTER — Encounter: Payer: PRIVATE HEALTH INSURANCE | Admitting: Cardiology

## 2023-01-27 ENCOUNTER — Other Ambulatory Visit: Payer: Self-pay | Admitting: Cardiology

## 2023-01-31 LAB — CUP PACEART REMOTE DEVICE CHECK
Battery Voltage: 85
Date Time Interrogation Session: 20240628114001
Implantable Lead Connection Status: 753985
Implantable Lead Connection Status: 753985
Implantable Lead Implant Date: 20221215
Implantable Lead Implant Date: 20221215
Implantable Lead Location: 753859
Implantable Lead Location: 753860
Implantable Lead Model: 377171
Implantable Lead Model: 377171
Implantable Lead Serial Number: 8000616455
Implantable Lead Serial Number: 8000672424
Implantable Pulse Generator Implant Date: 20221215
Pulse Gen Model: 407145
Pulse Gen Serial Number: 70262121

## 2023-02-05 NOTE — Progress Notes (Signed)
Remote pacemaker transmission.   

## 2023-02-14 ENCOUNTER — Encounter (HOSPITAL_BASED_OUTPATIENT_CLINIC_OR_DEPARTMENT_OTHER): Payer: Self-pay

## 2023-04-17 ENCOUNTER — Ambulatory Visit (INDEPENDENT_AMBULATORY_CARE_PROVIDER_SITE_OTHER): Payer: PRIVATE HEALTH INSURANCE

## 2023-04-17 DIAGNOSIS — I442 Atrioventricular block, complete: Secondary | ICD-10-CM | POA: Diagnosis not present

## 2023-04-17 LAB — CUP PACEART REMOTE DEVICE CHECK
Battery Voltage: 85
Date Time Interrogation Session: 20240912072245
Implantable Lead Connection Status: 753985
Implantable Lead Connection Status: 753985
Implantable Lead Implant Date: 20221215
Implantable Lead Implant Date: 20221215
Implantable Lead Location: 753859
Implantable Lead Location: 753860
Implantable Lead Model: 377171
Implantable Lead Model: 377171
Implantable Lead Serial Number: 8000616455
Implantable Lead Serial Number: 8000672424
Implantable Pulse Generator Implant Date: 20221215
Pulse Gen Model: 407145
Pulse Gen Serial Number: 70262121

## 2023-04-18 ENCOUNTER — Encounter: Payer: Self-pay | Admitting: Internal Medicine

## 2023-04-18 ENCOUNTER — Ambulatory Visit (INDEPENDENT_AMBULATORY_CARE_PROVIDER_SITE_OTHER): Payer: PRIVATE HEALTH INSURANCE | Admitting: Internal Medicine

## 2023-04-18 VITALS — BP 148/78 | HR 63 | Temp 97.5°F | Ht 71.0 in | Wt 243.7 lb

## 2023-04-18 DIAGNOSIS — Z1211 Encounter for screening for malignant neoplasm of colon: Secondary | ICD-10-CM

## 2023-04-18 DIAGNOSIS — Z23 Encounter for immunization: Secondary | ICD-10-CM

## 2023-04-18 DIAGNOSIS — I2510 Atherosclerotic heart disease of native coronary artery without angina pectoris: Secondary | ICD-10-CM

## 2023-04-18 DIAGNOSIS — I442 Atrioventricular block, complete: Secondary | ICD-10-CM

## 2023-04-18 DIAGNOSIS — R7303 Prediabetes: Secondary | ICD-10-CM | POA: Diagnosis not present

## 2023-04-18 DIAGNOSIS — I10 Essential (primary) hypertension: Secondary | ICD-10-CM | POA: Diagnosis not present

## 2023-04-18 DIAGNOSIS — Z1159 Encounter for screening for other viral diseases: Secondary | ICD-10-CM

## 2023-04-18 DIAGNOSIS — Z125 Encounter for screening for malignant neoplasm of prostate: Secondary | ICD-10-CM

## 2023-04-18 NOTE — Progress Notes (Signed)
New Patient Office Visit  Subjective    Patient ID: Alec Gibson., male    DOB: 1961/01/29  Age: 62 y.o. MRN: [id number]  CC:  Chief Complaint  Patient presents with   Establish Care    Specialist: Dr. Sheria Lang (cardio)     HPI Alec Ali. presents to establish care.  Hypertension/Complete Heartblock: -Medications: Chlorthalidone 25, Coreg 12.5 mg BID, Losartan 100 mg, KCl 20 mEq new since June -Patient is compliant with above medications and reports no side effects. -Checking BP at home (average): 140-150/70-80 -Denies any SOB, CP, vision changes, LE edema or symptoms of hypotension -Following with Cardiology and electrophysiology  -Pacemaker implanted 12/22  HLD/CAD: -Medications: Nothing  -Patient is compliant with above medications and reports no side effects.  -Last lipid panel: Lipid Panel     Component Value Date/Time   CHOL 172 07/05/2020 1150   TRIG 256.0 (H) 07/05/2020 1150   HDL 36.90 (L) 07/05/2020 1150   CHOLHDL 5 07/05/2020 1150   VLDL 51.2 (H) 07/05/2020 1150   LDLCALC 120 (H) 01/09/2015 1146   LDLDIRECT 86.0 07/05/2020 1150    The 10-year ASCVD risk score (Arnett DK, et al., 2019) is: 23.8%   Values used to calculate the score:     Age: 8 years     Sex: Male     Is Non-Hispanic African American: No     Diabetic: Yes     Tobacco smoker: No     Systolic Blood Pressure: 150 mmHg     Is BP treated: Yes     HDL Cholesterol: 36.9 mg/dL     Total Cholesterol: 172 mg/dL  Pre-Diabetes: -N8G 95/62 6.2% -Not currently on medications   GERD: -Currently on Pepcid 20 mg as needed, controls symptoms well  Health Maintenance: -Blood work due -Colon cancer screening due   Outpatient Encounter Medications as of 04/18/2023  Medication Sig   carvedilol (COREG) 12.5 MG tablet TAKE ONE TABLET BY MOUTH TWICE A DAY WITH MEALS   chlorthalidone (HYGROTON) 25 MG tablet TAKE ONE TABLET BY MOUTH ONCE DAILY   famotidine (PEPCID) 20 MG tablet Take  20 mg by mouth daily as needed for heartburn or indigestion.   ibuprofen (ADVIL) 200 MG tablet Take 200 mg by mouth every 6 (six) hours as needed.   losartan (COZAAR) 100 MG tablet TAKE 1 TABLET BY MOUTH IN THE EVENING   Multiple Vitamins-Minerals (MENS 50+ MULTI VITAMIN/MIN) TABS Take 1 tablet by mouth daily.   potassium chloride SA (KLOR-CON M) 20 MEQ tablet Take 1 tablet (20 mEq total) by mouth daily.   No facility-administered encounter medications on file as of 04/18/2023.    Past Medical History:  Diagnosis Date   GERD (gastroesophageal reflux disease)    EGD normal 08/08/2011 Oh    Hyperlipidemia    Hypertension    Nonobstructive CAD (coronary artery disease)    a. 07/2021 Cath: LM nl, LAD 30p/m, LCX mild diff dzs, OM1 50, RCA min irregs, RPDA/RPAV min irregs. EF 55-65%.    Past Surgical History:  Procedure Laterality Date   INGUINAL HERNIA REPAIR  1987   bilateral   LEFT HEART CATH AND CORONARY ANGIOGRAPHY N/A 07/16/2021   Procedure: LEFT HEART CATH AND CORONARY ANGIOGRAPHY;  Surgeon: Iran Ouch, MD;  Location: [facility] INVASIVE CV LAB;  Service: Cardiovascular;  Laterality: N/A;   PACEMAKER IMPLANT N/A 07/19/2021   Procedure: PACEMAKER IMPLANT;  Surgeon: Lanier Prude, MD;  Location: [facility] INVASIVE CV LAB;  Service:  Cardiovascular;  Laterality: N/A;   TONSILLECTOMY      Family History  Problem Relation Age of Onset   Breast cancer Mother    Diabetes Mother    Thyroid disease Mother    Hypertension Father    COPD Father    Colon cancer Maternal Grandmother 74    Social History   Socioeconomic History   Marital status: Married    Spouse name: Not on file   Number of children: Not on file   Years of education: Not on file   Highest education level: Not on file  Occupational History   Occupation: CONSTRUCTION    Employer: SELF EMPLOYED    Comment: Full time  Tobacco Use   Smoking status: Former   Smokeless tobacco: Former    Types: Snuff  Vaping Use    Vaping status: Never Used  Substance and Sexual Activity   Alcohol use: Yes    Comment: Occasional   Drug use: No   Sexual activity: Yes  Other Topics Concern   Not on file  Social History Narrative   Married; Does not get regular exercise   Social Determinants of Health   Financial Resource Strain: Not on file  Food Insecurity: Not on file  Transportation Needs: Not on file  Physical Activity: Not on file  Stress: Not on file  Social Connections: Not on file  Intimate Partner Violence: Not on file    Review of Systems  All other systems reviewed and are negative.       Objective    BP (!) 150/82   Pulse 63   Temp (!) 97.5 F (36.4 C)   Ht 5\' 11"  (1.803 m)   Wt 243 lb 11.2 oz (110.5 kg)   SpO2 94%   BMI 33.99 kg/m   Physical Exam Constitutional:      Appearance: Normal appearance.  HENT:     Head: Normocephalic and atraumatic.     Mouth/Throat:     Mouth: Mucous membranes are moist.     Pharynx: Oropharynx is clear.  Eyes:     Extraocular Movements: Extraocular movements intact.     Conjunctiva/sclera: Conjunctivae normal.     Pupils: Pupils are equal, round, and reactive to light.  Neck:     Comments: No thyromegaly  Cardiovascular:     Rate and Rhythm: Normal rate and regular rhythm.  Pulmonary:     Effort: Pulmonary effort is normal.     Breath sounds: Normal breath sounds.  Musculoskeletal:     Cervical back: No tenderness.     Right lower leg: No edema.     Left lower leg: No edema.  Lymphadenopathy:     Cervical: No cervical adenopathy.  Skin:    General: Skin is warm and dry.  Neurological:     General: No focal deficit present.     Mental Status: He is alert. Mental status is at baseline.  Psychiatric:        Mood and Affect: Mood normal.        Behavior: Behavior normal.         Assessment & Plan:   1. White coat syndrome with diagnosis of hypertension/Complete heart block (HCC): Following with Cardiology and  Electrophysiology. Currently on Chlorthalidone 25, Coreg 12.5 mg BID, Losartan 100 mg, KCl 20 mEq. Will recheck labs today.  - CBC w/Diff/Platelet - COMPLETE METABOLIC PANEL WITH GFR  2. Coronary artery disease involving native coronary artery of native heart without angina pectoris: Recheck cholesterol, ASCVD  risk elevated but not on a statin, will discuss at follow up.   - Lipid Profile  3. Prediabetes: History of diabetes in the chart but no A1c >6.5% in the last 7 years, patient states he is not a diabetic. Will recheck A1c today, not on any medication.  - Hemoglobin A1C  4. Encounter for hepatitis C screening test for low risk patient/Prostate cancer screening: Screening labs due.  - Hepatitis C Antibody - PSA  5. Colon cancer screening: Cologuard ordered.   - Cologuard  6. Need for Tdap vaccination: Tdap vaccine administered today.   - Tdap vaccine greater than or equal to 7yo IM    Return in about 6 months (around 10/16/2023).   Margarita Mail, DO

## 2023-04-18 NOTE — Patient Instructions (Signed)
It was great seeing you today!  Plan discussed at today's visit: -Blood work ordered today, results will be uploaded to MyChart.  -Cologuard ordered -Tdap and flu vaccine given today  Follow up in: 6 months   Take care and let us know if you have any questions or concerns prior to your next visit.  Dr. Caralee Ates

## 2023-04-19 LAB — HEPATITIS C ANTIBODY: Hepatitis C Ab: NONREACTIVE

## 2023-04-19 LAB — COMPLETE METABOLIC PANEL WITH GFR
AG Ratio: 1.5 (calc) (ref 1.0–2.5)
ALT: 34 U/L (ref 9–46)
AST: 28 U/L (ref 10–35)
Albumin: 4.6 g/dL (ref 3.6–5.1)
Alkaline phosphatase (APISO): 54 U/L (ref 35–144)
BUN: 15 mg/dL (ref 7–25)
CO2: 27 mmol/L (ref 20–32)
Calcium: 9.6 mg/dL (ref 8.6–10.3)
Chloride: 98 mmol/L (ref 98–110)
Creat: 0.79 mg/dL (ref 0.70–1.30)
Globulin: 3 g/dL (ref 1.9–3.7)
Glucose, Bld: 107 mg/dL — ABNORMAL HIGH (ref 65–99)
Potassium: 3.7 mmol/L (ref 3.5–5.3)
Sodium: 137 mmol/L (ref 135–146)
Total Bilirubin: 1 mg/dL (ref 0.2–1.2)
Total Protein: 7.6 g/dL (ref 6.1–8.1)
eGFR: 102 mL/min/{1.73_m2} (ref >=60)

## 2023-04-19 LAB — LIPID PANEL
Cholesterol: 179 mg/dL (ref <200)
HDL: 41 mg/dL (ref >=40)
LDL Cholesterol (Calc): 98 mg/dL
Non-HDL Cholesterol (Calc): 138 mg/dL — ABNORMAL HIGH (ref <130)
Total CHOL/HDL Ratio: 4.4 (calc) (ref <5.0)
Triglycerides: 281 mg/dL — ABNORMAL HIGH (ref <150)

## 2023-04-19 LAB — CBC WITH DIFFERENTIAL/PLATELET
Absolute Monocytes: 627 {cells}/uL (ref 200–950)
Basophils Absolute: 29 {cells}/uL (ref 0–200)
Basophils Relative: 0.5 %
Eosinophils Absolute: 211 {cells}/uL (ref 15–500)
Eosinophils Relative: 3.7 %
HCT: 44.9 % (ref 38.5–50.0)
Hemoglobin: 15.5 g/dL (ref 13.2–17.1)
Lymphs Abs: 1915 {cells}/uL (ref 850–3900)
MCH: 29.6 pg (ref 27.0–33.0)
MCHC: 34.5 g/dL (ref 32.0–36.0)
MCV: 85.7 fL (ref 80.0–100.0)
MPV: 10.2 fL (ref 7.5–12.5)
Monocytes Relative: 11 %
Neutro Abs: 2918 {cells}/uL (ref 1500–7800)
Neutrophils Relative %: 51.2 %
Platelets: 241 10*3/uL (ref 140–400)
RBC: 5.24 10*6/uL (ref 4.20–5.80)
RDW: 13 % (ref 11.0–15.0)
Total Lymphocyte: 33.6 %
WBC: 5.7 10*3/uL (ref 3.8–10.8)

## 2023-04-19 LAB — HEMOGLOBIN A1C
Hgb A1c MFr Bld: 6.6 %{Hb} — ABNORMAL HIGH (ref <5.7)
Mean Plasma Glucose: 143 mg/dL
eAG (mmol/L): 7.9 mmol/L

## 2023-04-19 LAB — PSA: PSA: 0.25 ng/mL (ref <=4.00)

## 2023-04-22 ENCOUNTER — Other Ambulatory Visit: Payer: Self-pay | Admitting: Cardiology

## 2023-04-25 NOTE — Progress Notes (Signed)
Remote pacemaker transmission.   

## 2023-04-27 IMAGING — CR DG CHEST 2V
2 series · 2 of 2 positions shown · non-contrast
Comparison: 07/15/2021

CLINICAL DATA: Pacemaker placement

EXAM:
CHEST - 2 VIEW

[chest lat]
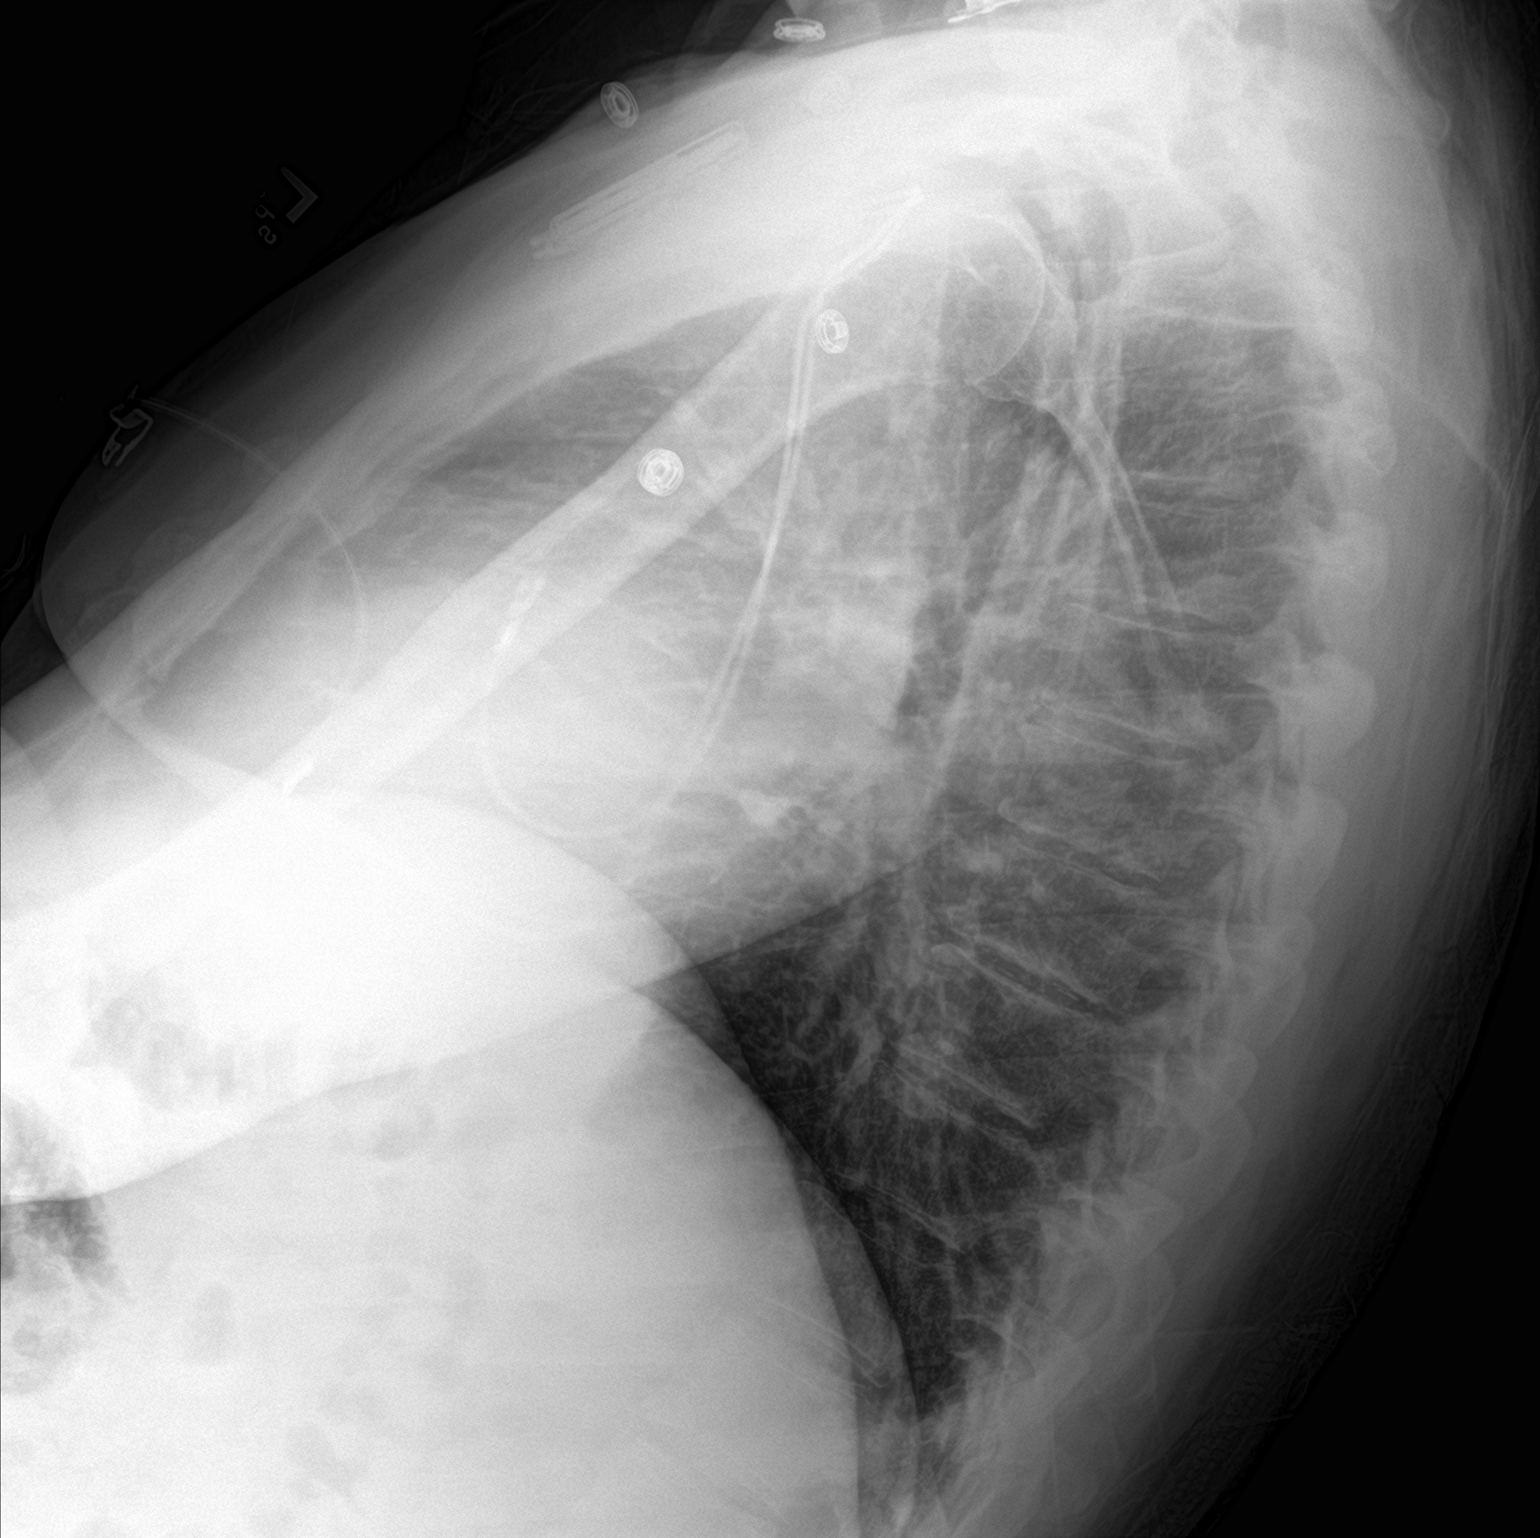

[chest ap]
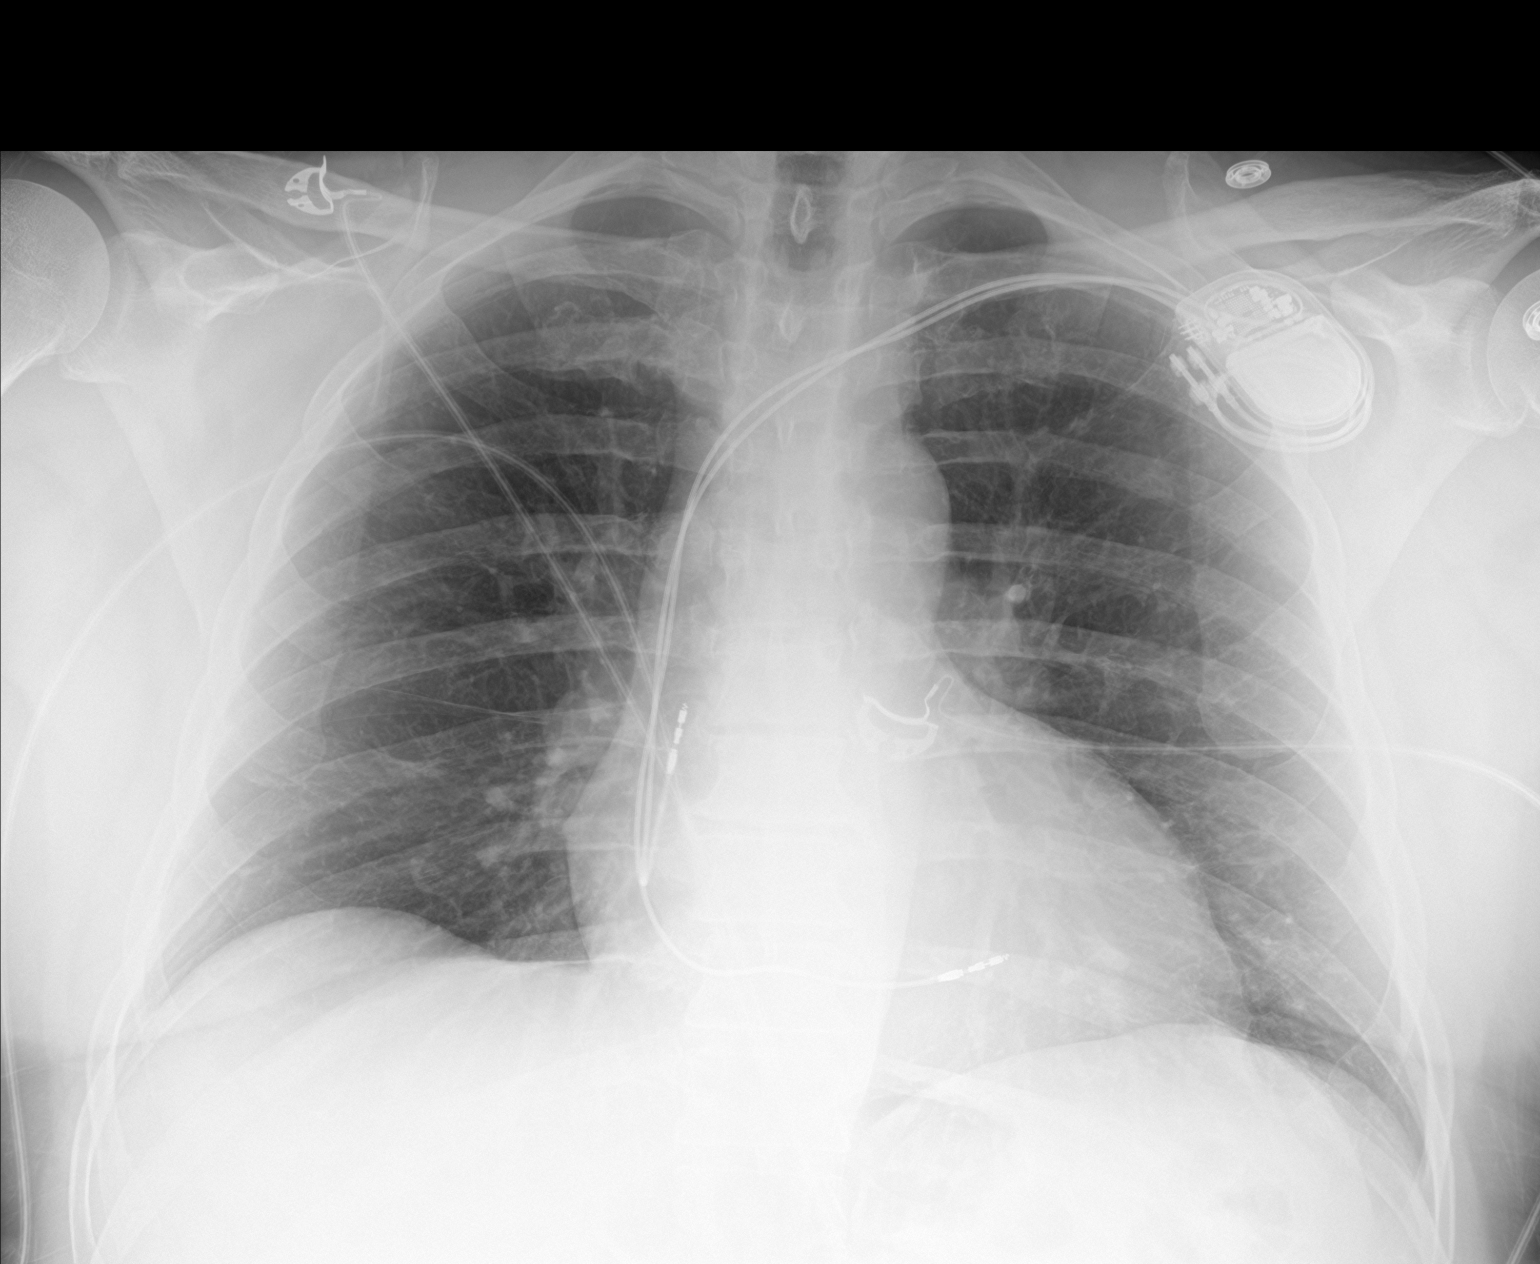

[2 of 2 positions shown; findings below may reference images not displayed]

FINDINGS: Cardiomegaly. Left chest multi lead pacer. Both lungs are clear. The
visualized skeletal structures are unremarkable.
IMPRESSION: Cardiomegaly with left chest multi lead pacer. No acute abnormality
of the lungs.

## 2023-07-17 ENCOUNTER — Other Ambulatory Visit: Payer: Self-pay | Admitting: Cardiology

## 2023-07-17 ENCOUNTER — Telehealth: Payer: Self-pay | Admitting: Cardiovascular Disease

## 2023-07-17 ENCOUNTER — Ambulatory Visit: Payer: PRIVATE HEALTH INSURANCE

## 2023-07-17 DIAGNOSIS — I442 Atrioventricular block, complete: Secondary | ICD-10-CM

## 2023-07-17 LAB — CUP PACEART REMOTE DEVICE CHECK
Battery Voltage: 85
Date Time Interrogation Session: 20241212082046
Implantable Lead Connection Status: 753985
Implantable Lead Connection Status: 753985
Implantable Lead Implant Date: 20221215
Implantable Lead Implant Date: 20221215
Implantable Lead Location: 753859
Implantable Lead Location: 753860
Implantable Lead Model: 377171
Implantable Lead Model: 377171
Implantable Lead Serial Number: 8000616455
Implantable Lead Serial Number: 8000672424
Implantable Pulse Generator Implant Date: 20221215
Pulse Gen Model: 407145
Pulse Gen Serial Number: 70262121

## 2023-07-17 MED ORDER — CARVEDILOL 12.5 MG PO TABS: 12.5000 mg | ORAL_TABLET | Freq: Two times a day (BID) | ORAL | 0 refills | Status: DC

## 2023-07-17 MED ORDER — CHLORTHALIDONE 25 MG PO TABS: 25.0000 mg | ORAL_TABLET | Freq: Every day | ORAL | 0 refills | Status: DC

## 2023-07-17 MED ORDER — POTASSIUM CHLORIDE CRYS ER 20 MEQ PO TBCR: 20.0000 meq | EXTENDED_RELEASE_TABLET | Freq: Every day | ORAL | 0 refills | Status: DC

## 2023-07-17 MED ORDER — LOSARTAN POTASSIUM 100 MG PO TABS: 100.0000 mg | ORAL_TABLET | Freq: Every evening | ORAL | 0 refills | Status: DC

## 2023-07-17 NOTE — Telephone Encounter (Signed)
Requested Prescriptions   Signed Prescriptions Disp Refills   carvedilol (COREG) 12.5 MG tablet 180 tablet 0    Sig: Take 1 tablet (12.5 mg total) by mouth 2 (two) times daily with a meal.    Authorizing Provider: Steffanie Dunn T    Ordering User: Shawnie Dapper, Teodoro Jeffreys C   chlorthalidone (HYGROTON) 25 MG tablet 90 tablet 0    Sig: Take 1 tablet (25 mg total) by mouth daily.    Authorizing Provider: Lanier Prude    Ordering User: Iverson Alamin C   losartan (COZAAR) 100 MG tablet 90 tablet 0    Sig: Take 1 tablet (100 mg total) by mouth every evening.    Authorizing Provider: Lanier Prude    Ordering User: Shawnie Dapper, Furkan Keenum C   potassium chloride SA (KLOR-CON M) 20 MEQ tablet 90 tablet 0    Sig: Take 1 tablet (20 mEq total) by mouth daily.    Authorizing Provider: Sherie Don    Ordering User: Kendrick Fries

## 2023-07-17 NOTE — Telephone Encounter (Signed)
*  STAT* If patient is at the pharmacy, call can be transferred to refill team.   1. Which medications need to be refilled? (please list name of each medication and dose if known)   losartan (COZAAR) 100 MG tablet  chlorthalidone (HYGROTON) 25 MG tablet  potassium chloride SA (KLOR-CON M) 20 MEQ tablet  carvedilol (COREG) 12.5 MG tablet   2. Would you like to learn more about the convenience, safety, & potential cost savings by using the [facility]?   3. Are you open to using the [facility] (Type [facility]. ).  4. Which pharmacy/location (including street and city if local pharmacy) is medication to be sent to?  [id number] - [place], [place] - [address]   5. Do they need a 30 day or 90 day supply?   90 day  Wife Zella Ball) stated patient still has some of these medications.

## 2023-07-22 NOTE — Progress Notes (Unsigned)
  Electrophysiology Office Follow up Visit Note:    Date:  07/23/2023   ID:  Alec Gibson., DOB March 17, 1961, MRN [id number]  PCP:  Margarita Mail, DO  [facility] Cardiologist:  Julien Nordmann, MD  [facility] Electrophysiologist:  Lanier Prude, MD    Interval History:     Alec Pol Keni Arendt. is a 62 y.o. male who presents for a follow up visit.  I last saw the patient 10/24/2021. He has CHB with a PPM in situ. He saw Luella Cook 01/20/2023.  Since the last appointment, he has done well.  He has no complaints today.  No problems with his pacemaker.        Past medical, surgical, social and family history were reviewed.  ROS:   Please see the history of present illness.    All other systems reviewed and are negative.  EKGs/Labs/Other Studies Reviewed:    The following studies were reviewed today:  07/23/2023 in clinic device interrogation personally reviewed Battery and lead parameter stable.  No programming changes made today.        Physical Exam:    VS:  BP (!) 140/86 (BP Location: Left Arm, Patient Position: Sitting, Cuff Size: Large)   Pulse 66   Ht 5\' 11"  (1.803 m)   Wt 246 lb 6.4 oz (111.8 kg)   SpO2 98%   BMI 34.37 kg/m     Wt Readings from Last 3 Encounters:  07/23/23 246 lb 6.4 oz (111.8 kg)  04/18/23 243 lb 11.2 oz (110.5 kg)  01/20/23 240 lb (108.9 kg)     GEN: no distress CARD: RRR, No MRG. CIED pocket well healed. RESP: No IWOB. CTAB.      ASSESSMENT:    1. Complete heart block (HCC)   2. Pacemaker   3. PVC (premature ventricular contraction)   4. Essential hypertension    PLAN:    In order of problems listed above:  #CHB #PPM in situ Doing well. PPM functioning appropriately. Continue remote monitoring.  #Hypertension At goal today.  Recommend checking blood pressures 1-2 times per week at home and recording the values.  Recommend bringing these recordings to the primary care physician.  #PVC Continue  coreg  Follow up 1y w APP.   Signed, Steffanie Dunn, MD, [facility], [facility] 07/23/2023 8:46 AM    Electrophysiology [facility]

## 2023-07-23 ENCOUNTER — Encounter: Payer: Self-pay | Admitting: Cardiology

## 2023-07-23 ENCOUNTER — Ambulatory Visit: Payer: PRIVATE HEALTH INSURANCE | Attending: Cardiology | Admitting: Cardiology

## 2023-07-23 VITALS — BP 140/86 | HR 66 | Ht 71.0 in | Wt 246.4 lb

## 2023-07-23 DIAGNOSIS — I10 Essential (primary) hypertension: Secondary | ICD-10-CM | POA: Diagnosis not present

## 2023-07-23 DIAGNOSIS — I442 Atrioventricular block, complete: Secondary | ICD-10-CM

## 2023-07-23 DIAGNOSIS — Z950 Presence of cardiac pacemaker: Secondary | ICD-10-CM | POA: Diagnosis not present

## 2023-07-23 DIAGNOSIS — I493 Ventricular premature depolarization: Secondary | ICD-10-CM | POA: Diagnosis not present

## 2023-07-23 LAB — PACEMAKER DEVICE OBSERVATION

## 2023-07-23 NOTE — Patient Instructions (Signed)
 Medication Instructions:  Your physician recommends that you continue on your current medications as directed. Please refer to the Current Medication list given to you today.  *If you need a refill on your cardiac medications before your next appointment, please call your pharmacy*  Follow-Up: At [facility], you and your health needs are our priority.  As part of our continuing mission to provide you with exceptional heart care, we have created designated Provider Care Teams.  These Care Teams include your primary Cardiologist (physician) and Advanced Practice Providers (APPs -  Physician Assistants and Nurse Practitioners) who all work together to provide you with the care you need, when you need it.  Your next appointment:   1 year  Provider:   You will see one of the following Advanced Practice Providers on your designated Care Team:   Francis Dowse, Charlott Holler [address]" [place], [place] Sherie Don, NP Canary Brim, NP

## 2023-07-28 LAB — CUP PACEART INCLINIC DEVICE CHECK
Date Time Interrogation Session: 20241218074146
Implantable Lead Connection Status: 753985
Implantable Lead Connection Status: 753985
Implantable Lead Implant Date: 20221215
Implantable Lead Implant Date: 20221215
Implantable Lead Location: 753859
Implantable Lead Location: 753860
Implantable Lead Model: 377171
Implantable Lead Model: 377171
Implantable Lead Serial Number: 8000616455
Implantable Lead Serial Number: 8000672424
Implantable Pulse Generator Implant Date: 20221215
Pulse Gen Model: 407145
Pulse Gen Serial Number: 70262121

## 2023-08-21 ENCOUNTER — Encounter: Payer: Self-pay | Admitting: Cardiology

## 2023-08-21 NOTE — Progress Notes (Signed)
Remote pacemaker transmission.   

## 2023-10-01 ENCOUNTER — Other Ambulatory Visit: Payer: Self-pay | Admitting: Cardiology

## 2023-10-15 LAB — CUP PACEART REMOTE DEVICE CHECK
Battery Voltage: 80
Date Time Interrogation Session: 20250312083553
Implantable Lead Connection Status: 753985
Implantable Lead Connection Status: 753985
Implantable Lead Implant Date: 20221215
Implantable Lead Implant Date: 20221215
Implantable Lead Location: 753859
Implantable Lead Location: 753860
Implantable Lead Model: 377171
Implantable Lead Model: 377171
Implantable Lead Serial Number: 8000616455
Implantable Lead Serial Number: 8000672424
Implantable Pulse Generator Implant Date: 20221215
Pulse Gen Model: 407145
Pulse Gen Serial Number: 70262121

## 2023-10-16 ENCOUNTER — Encounter: Payer: Self-pay | Admitting: Cardiology

## 2023-10-16 ENCOUNTER — Ambulatory Visit: Payer: PRIVATE HEALTH INSURANCE

## 2023-10-16 DIAGNOSIS — I442 Atrioventricular block, complete: Secondary | ICD-10-CM

## 2023-10-17 ENCOUNTER — Ambulatory Visit: Payer: Self-pay | Admitting: Internal Medicine

## 2023-11-06 ENCOUNTER — Ambulatory Visit: Payer: PRIVATE HEALTH INSURANCE | Admitting: Internal Medicine

## 2023-11-20 ENCOUNTER — Encounter: Payer: Self-pay | Admitting: Internal Medicine

## 2023-11-20 ENCOUNTER — Other Ambulatory Visit: Payer: Self-pay

## 2023-11-20 ENCOUNTER — Ambulatory Visit (INDEPENDENT_AMBULATORY_CARE_PROVIDER_SITE_OTHER): Payer: PRIVATE HEALTH INSURANCE | Admitting: Internal Medicine

## 2023-11-20 VITALS — BP 132/84 | HR 74 | Temp 98.1°F | Resp 16 | Ht 71.0 in | Wt 241.8 lb

## 2023-11-20 DIAGNOSIS — R7303 Prediabetes: Secondary | ICD-10-CM | POA: Diagnosis not present

## 2023-11-20 DIAGNOSIS — I2510 Atherosclerotic heart disease of native coronary artery without angina pectoris: Secondary | ICD-10-CM | POA: Insufficient documentation

## 2023-11-20 DIAGNOSIS — I442 Atrioventricular block, complete: Secondary | ICD-10-CM

## 2023-11-20 DIAGNOSIS — Z1211 Encounter for screening for malignant neoplasm of colon: Secondary | ICD-10-CM

## 2023-11-20 DIAGNOSIS — I10 Essential (primary) hypertension: Secondary | ICD-10-CM | POA: Diagnosis not present

## 2023-11-20 LAB — POCT GLYCOSYLATED HEMOGLOBIN (HGB A1C): Hemoglobin A1C: 6.5 % — AB (ref 4.0–5.6)

## 2023-11-20 NOTE — Assessment & Plan Note (Signed)
 A1c 6.6%, fasting blood sugar was 109 on last labs. Discussed starting medication for blood sugar which patient declined. Discussed diet and lifestyle changes, recheck A1c at follow up.

## 2023-11-20 NOTE — Assessment & Plan Note (Signed)
 Reviewed lipid panel, LDL 98 but ASCVD risk elevated.

## 2023-11-20 NOTE — Progress Notes (Signed)
 Established Patient Office Visit  Subjective    Patient ID: Alec Ali., male    DOB: 05-26-1961  Age: 63 y.o. MRN: [id number]  CC:  Chief Complaint  Patient presents with   Medical Management of Chronic Issues    6 month follow up    HPI Alec Ali. presents to follow up on chronic medical conditions. Overall he is doing well and has no questions or concerns today.  Hypertension/Complete Heartblock: -Medications: Chlorthalidone 25, Coreg 12.5 mg BID, Losartan 100 mg, KCl 20 mEq new since June -Patient is compliant with above medications and reports no side effects. -Checking BP at home (average): 140-150/70-80 -Denies any SOB, CP, vision changes, LE edema or symptoms of hypotension -Following with Cardiology and electrophysiology  -Pacemaker implanted 12/22  HLD/CAD: -Medications: Nothing  -Patient is compliant with above medications and reports no side effects.  -Last lipid panel: Lipid Panel     Component Value Date/Time   CHOL 179 04/18/2023 1454   TRIG 281 (H) 04/18/2023 1454   HDL 41 04/18/2023 1454   CHOLHDL 4.4 04/18/2023 1454   VLDL 51.2 (H) 07/05/2020 1150   LDLCALC 98 04/18/2023 1454   LDLDIRECT 86.0 07/05/2020 1150    The 10-year ASCVD risk score (Arnett DK, et al., 2019) is: 20.2%   Values used to calculate the score:     Age: 35 years     Sex: Male     Is Non-Hispanic African American: No     Diabetic: Yes     Tobacco smoker: No     Systolic Blood Pressure: 132 mmHg     Is BP treated: Yes     HDL Cholesterol: 41 mg/dL     Total Cholesterol: 179 mg/dL  Pre-Diabetes: -N5A 2/13 6.6%, fasting glucose 107 -Not currently on medications   GERD: -Currently on Pepcid 20 mg as needed, controls symptoms well  Health Maintenance: -Blood work UTD -Colon cancer screening due. Cologuard ordered previously    Outpatient Encounter Medications as of 11/20/2023  Medication Sig   carvedilol (COREG) 12.5 MG tablet TAKE ONE TABLET BY MOUTH  TWICE DAILY WITH MEALS. KEEP FUTURE APPOINTMENT FOR LONG TERM REFILLS.   chlorthalidone (HYGROTON) 25 MG tablet TAKE ONE TABLET BY MOUTH ONCE DAILY   famotidine (PEPCID) 20 MG tablet Take 20 mg by mouth daily as needed for heartburn or indigestion.   ibuprofen (ADVIL) 200 MG tablet Take 200 mg by mouth every 6 (six) hours as needed.   losartan (COZAAR) 100 MG tablet TAKE ONE TABLET BY MOUTH ONCE DAILY IN THE EVENING. KEEP FUTURE APPOINTMENT FOR LONG TERM REFILLS.   Multiple Vitamins-Minerals (MENS 50+ MULTI VITAMIN/MIN) TABS Take 1 tablet by mouth daily.   potassium chloride SA (KLOR-CON M) 20 MEQ tablet Take 1 tablet (20 mEq total) by mouth daily.   No facility-administered encounter medications on file as of 11/20/2023.    Past Medical History:  Diagnosis Date   GERD (gastroesophageal reflux disease)    EGD normal 08/08/2011 [facility]    Hyperlipidemia    Hypertension    Nonobstructive CAD (coronary artery disease)    a. 07/2021 Cath: LM nl, LAD 30p/m, LCX mild diff dzs, OM1 50, RCA min irregs, RPDA/RPAV min irregs. EF 55-65%.    Past Surgical History:  Procedure Laterality Date   INGUINAL HERNIA REPAIR  1987   bilateral   LEFT HEART CATH AND CORONARY ANGIOGRAPHY N/A 07/16/2021   Procedure: LEFT HEART CATH AND CORONARY ANGIOGRAPHY;  Surgeon: Iran Ouch,  MD;  Location: [facility] INVASIVE CV LAB;  Service: Cardiovascular;  Laterality: N/A;   PACEMAKER IMPLANT N/A 07/19/2021   Procedure: PACEMAKER IMPLANT;  Surgeon: Boyce Byes, MD;  Location: [facility] INVASIVE CV LAB;  Service: Cardiovascular;  Laterality: N/A;   TONSILLECTOMY      Family History  Problem Relation Age of Onset   Breast cancer Mother    Diabetes Mother    Thyroid disease Mother    Hypertension Father    COPD Father    Colon cancer Maternal Grandmother 65    Social History   Socioeconomic History   Marital status: Married    Spouse name: Not on file   Number of children: Not on file   Years of education: Not on  file   Highest education level: Not on file  Occupational History   Occupation: CONSTRUCTION    Employer: SELF EMPLOYED    Comment: Full time  Tobacco Use   Smoking status: Former   Smokeless tobacco: Former    Types: Snuff  Vaping Use   Vaping status: Never Used  Substance and Sexual Activity   Alcohol use: Yes    Comment: Occasional   Drug use: No   Sexual activity: Yes  Other Topics Concern   Not on file  Social History Narrative   Married; Does not get regular exercise   Social Drivers of Health   Financial Resource Strain: Not on file  Food Insecurity: Not on file  Transportation Needs: Not on file  Physical Activity: Not on file  Stress: Not on file  Social Connections: Not on file  Intimate Partner Violence: Not on file    Review of Systems  All other systems reviewed and are negative.       Objective    BP 132/84 (Cuff Size: Large)   Pulse 74   Temp 98.1 F (36.7 C) (Oral)   Resp 16   Ht 5\' 11"  (1.803 m)   Wt 241 lb 12.8 oz (109.7 kg)   SpO2 98%   BMI 33.72 kg/m   Physical Exam Constitutional:      Appearance: Normal appearance.  HENT:     Head: Normocephalic and atraumatic.     Mouth/Throat:     Mouth: Mucous membranes are moist.     Pharynx: Oropharynx is clear.  Eyes:     Extraocular Movements: Extraocular movements intact.     Conjunctiva/sclera: Conjunctivae normal.     Pupils: Pupils are equal, round, and reactive to light.  Musculoskeletal:     Right lower leg: No edema.     Left lower leg: No edema.  Skin:    General: Skin is warm and dry.  Neurological:     General: No focal deficit present.     Mental Status: He is alert. Mental status is at baseline.  Psychiatric:        Mood and Affect: Mood normal.        Behavior: Behavior normal.         Assessment & Plan:   Prediabetes Assessment & Plan: A1c 6.6%, fasting blood sugar was 109 on last labs. Discussed starting medication for blood sugar which patient declined.  Discussed diet and lifestyle changes, recheck A1c at follow up.  Orders: -     POCT glycosylated hemoglobin (Hb A1C)  Hypertension, unspecified type Assessment & Plan: Blood pressure stable here today, no changes made to medications.    Complete heart block [facility]) Assessment & Plan: Following with Cardiology, has a pacemaker and  beta blocker.   Coronary artery disease involving native coronary artery of native heart without angina pectoris Assessment & Plan: Reviewed lipid panel, LDL 98 but ASCVD risk elevated.    Colon cancer screening -     Cologuard      Return in about 6 months (around 05/21/2024).   Rockney Cid, DO

## 2023-11-20 NOTE — Assessment & Plan Note (Signed)
 Blood pressure stable here today, no changes made to medications.

## 2023-11-20 NOTE — Assessment & Plan Note (Signed)
 Following with Cardiology, has a pacemaker and beta blocker.

## 2023-11-28 NOTE — Progress Notes (Signed)
 Remote pacemaker transmission.

## 2024-01-15 ENCOUNTER — Ambulatory Visit (INDEPENDENT_AMBULATORY_CARE_PROVIDER_SITE_OTHER): Payer: PRIVATE HEALTH INSURANCE

## 2024-01-15 DIAGNOSIS — I442 Atrioventricular block, complete: Secondary | ICD-10-CM

## 2024-01-15 LAB — CUP PACEART REMOTE DEVICE CHECK
Date Time Interrogation Session: 20250612142417
Implantable Lead Connection Status: 753985
Implantable Lead Connection Status: 753985
Implantable Lead Implant Date: 20221215
Implantable Lead Implant Date: 20221215
Implantable Lead Location: 753859
Implantable Lead Location: 753860
Implantable Lead Model: 377171
Implantable Lead Model: 377171
Implantable Lead Serial Number: 8000616455
Implantable Lead Serial Number: 8000672424
Implantable Pulse Generator Implant Date: 20221215
Pulse Gen Model: 407145
Pulse Gen Serial Number: 70262121

## 2024-01-17 ENCOUNTER — Ambulatory Visit: Payer: Self-pay | Admitting: Cardiology

## 2024-03-08 NOTE — Progress Notes (Signed)
 Remote pacemaker transmission.

## 2024-04-15 ENCOUNTER — Ambulatory Visit: Payer: PRIVATE HEALTH INSURANCE

## 2024-04-26 ENCOUNTER — Other Ambulatory Visit: Payer: Self-pay | Admitting: Cardiology

## 2024-05-21 ENCOUNTER — Ambulatory Visit: Payer: PRIVATE HEALTH INSURANCE | Admitting: Internal Medicine

## 2024-07-15 ENCOUNTER — Ambulatory Visit: Payer: PRIVATE HEALTH INSURANCE

## 2024-07-16 LAB — CUP PACEART REMOTE DEVICE CHECK
Battery Voltage: 75
Date Time Interrogation Session: 20251211072457
Implantable Lead Connection Status: 753985
Implantable Lead Connection Status: 753985
Implantable Lead Implant Date: 20221215
Implantable Lead Implant Date: 20221215
Implantable Lead Location: 753859
Implantable Lead Location: 753860
Implantable Lead Model: 377171
Implantable Lead Model: 377171
Implantable Lead Serial Number: 8000616455
Implantable Lead Serial Number: 8000672424
Implantable Pulse Generator Implant Date: 20221215
Pulse Gen Model: 407145
Pulse Gen Serial Number: 70262121

## 2024-07-22 NOTE — Progress Notes (Signed)
 Remote PPM Transmission

## 2024-07-23 ENCOUNTER — Ambulatory Visit: Payer: Self-pay | Admitting: Cardiology

## 2024-10-14 ENCOUNTER — Ambulatory Visit: Payer: PRIVATE HEALTH INSURANCE

## 2025-01-13 ENCOUNTER — Ambulatory Visit: Payer: PRIVATE HEALTH INSURANCE

## 2025-04-14 ENCOUNTER — Ambulatory Visit: Payer: PRIVATE HEALTH INSURANCE
